# Patient Record
Sex: Female | Born: 1935 | Hispanic: No | Smoking: Former smoker
Health system: Southern US, Community
[De-identification: ages and names within clinical notes are randomized; demographics above are authoritative.]

## PROBLEM LIST (undated history)

## (undated) DIAGNOSIS — I739 Peripheral vascular disease, unspecified: Secondary | ICD-10-CM

## (undated) DIAGNOSIS — Z8781 Personal history of (healed) traumatic fracture: Secondary | ICD-10-CM

## (undated) DIAGNOSIS — I272 Pulmonary hypertension, unspecified: Secondary | ICD-10-CM

## (undated) DIAGNOSIS — N183 Chronic kidney disease, stage 3 unspecified: Secondary | ICD-10-CM

## (undated) DIAGNOSIS — I5032 Chronic diastolic (congestive) heart failure: Secondary | ICD-10-CM

## (undated) DIAGNOSIS — J449 Chronic obstructive pulmonary disease, unspecified: Secondary | ICD-10-CM

## (undated) DIAGNOSIS — E039 Hypothyroidism, unspecified: Secondary | ICD-10-CM

## (undated) DIAGNOSIS — M1990 Unspecified osteoarthritis, unspecified site: Secondary | ICD-10-CM

## (undated) DIAGNOSIS — I10 Essential (primary) hypertension: Secondary | ICD-10-CM

## (undated) DIAGNOSIS — I779 Disorder of arteries and arterioles, unspecified: Secondary | ICD-10-CM

## (undated) DIAGNOSIS — I4891 Unspecified atrial fibrillation: Secondary | ICD-10-CM

## (undated) DIAGNOSIS — R609 Edema, unspecified: Secondary | ICD-10-CM

## (undated) HISTORY — DX: Pulmonary hypertension, unspecified: I27.20

## (undated) HISTORY — PX: HEMORRHOID SURGERY: SHX153

## (undated) HISTORY — DX: Chronic kidney disease, stage 3 unspecified: N18.30

## (undated) HISTORY — DX: Chronic kidney disease, stage 3 (moderate): N18.3

## (undated) HISTORY — DX: Peripheral vascular disease, unspecified: I73.9

## (undated) HISTORY — DX: Personal history of (healed) traumatic fracture: Z87.81

## (undated) HISTORY — DX: Essential (primary) hypertension: I10

## (undated) HISTORY — DX: Edema, unspecified: R60.9

## (undated) HISTORY — DX: Chronic diastolic (congestive) heart failure: I50.32

## (undated) HISTORY — DX: Unspecified atrial fibrillation: I48.91

## (undated) HISTORY — DX: Disorder of arteries and arterioles, unspecified: I77.9

## (undated) HISTORY — DX: Hypothyroidism, unspecified: E03.9

---

## 2011-12-04 DIAGNOSIS — I5033 Acute on chronic diastolic (congestive) heart failure: Secondary | ICD-10-CM

## 2011-12-04 DIAGNOSIS — R0602 Shortness of breath: Secondary | ICD-10-CM

## 2011-12-17 ENCOUNTER — Inpatient Hospital Stay (HOSPITAL_COMMUNITY)
Admission: AD | Admit: 2011-12-17 | Discharge: 2011-12-22 | DRG: 208 | Disposition: A | Discharge status: Home / Self Care | Payer: Medicare Other | Source: Other Acute Inpatient Hospital | Attending: Pulmonary Disease | Admitting: Pulmonary Disease

## 2011-12-17 ENCOUNTER — Inpatient Hospital Stay (HOSPITAL_COMMUNITY): Payer: Medicare Other

## 2011-12-17 ENCOUNTER — Encounter (HOSPITAL_COMMUNITY): Payer: Self-pay | Admitting: Internal Medicine

## 2011-12-17 DIAGNOSIS — N189 Chronic kidney disease, unspecified: Secondary | ICD-10-CM | POA: Diagnosis present

## 2011-12-17 DIAGNOSIS — J449 Chronic obstructive pulmonary disease, unspecified: Secondary | ICD-10-CM

## 2011-12-17 DIAGNOSIS — R7309 Other abnormal glucose: Secondary | ICD-10-CM | POA: Diagnosis present

## 2011-12-17 DIAGNOSIS — N179 Acute kidney failure, unspecified: Secondary | ICD-10-CM | POA: Diagnosis present

## 2011-12-17 DIAGNOSIS — N289 Disorder of kidney and ureter, unspecified: Secondary | ICD-10-CM

## 2011-12-17 DIAGNOSIS — J9600 Acute respiratory failure, unspecified whether with hypoxia or hypercapnia: Principal | ICD-10-CM | POA: Diagnosis present

## 2011-12-17 DIAGNOSIS — I129 Hypertensive chronic kidney disease with stage 1 through stage 4 chronic kidney disease, or unspecified chronic kidney disease: Secondary | ICD-10-CM | POA: Diagnosis present

## 2011-12-17 DIAGNOSIS — I4891 Unspecified atrial fibrillation: Secondary | ICD-10-CM

## 2011-12-17 DIAGNOSIS — I5033 Acute on chronic diastolic (congestive) heart failure: Secondary | ICD-10-CM | POA: Diagnosis present

## 2011-12-17 DIAGNOSIS — J189 Pneumonia, unspecified organism: Secondary | ICD-10-CM | POA: Diagnosis present

## 2011-12-17 DIAGNOSIS — J9602 Acute respiratory failure with hypercapnia: Secondary | ICD-10-CM

## 2011-12-17 DIAGNOSIS — I2789 Other specified pulmonary heart diseases: Secondary | ICD-10-CM | POA: Diagnosis present

## 2011-12-17 DIAGNOSIS — I160 Hypertensive urgency: Secondary | ICD-10-CM

## 2011-12-17 DIAGNOSIS — I509 Heart failure, unspecified: Secondary | ICD-10-CM | POA: Diagnosis present

## 2011-12-17 DIAGNOSIS — J441 Chronic obstructive pulmonary disease with (acute) exacerbation: Secondary | ICD-10-CM | POA: Diagnosis present

## 2011-12-17 DIAGNOSIS — I10 Essential (primary) hypertension: Secondary | ICD-10-CM

## 2011-12-17 DIAGNOSIS — I272 Pulmonary hypertension, unspecified: Secondary | ICD-10-CM

## 2011-12-17 DIAGNOSIS — Z23 Encounter for immunization: Secondary | ICD-10-CM

## 2011-12-17 DIAGNOSIS — I5032 Chronic diastolic (congestive) heart failure: Secondary | ICD-10-CM

## 2011-12-17 HISTORY — DX: Chronic obstructive pulmonary disease, unspecified: J44.9

## 2011-12-17 LAB — COMPREHENSIVE METABOLIC PANEL
Albumin: 3.3 g/dL — ABNORMAL LOW (ref 3.5–5.2)
Alkaline Phosphatase: 63 U/L (ref 39–117)
BUN: 30 mg/dL — ABNORMAL HIGH (ref 6–23)
Calcium: 9.1 mg/dL (ref 8.4–10.5)
Creatinine, Ser: 1.34 mg/dL — ABNORMAL HIGH (ref 0.50–1.10)
GFR calc Af Amer: 44 mL/min — ABNORMAL LOW (ref >90)
Glucose, Bld: 149 mg/dL — ABNORMAL HIGH (ref 70–99)
Potassium: 4.3 mEq/L (ref 3.5–5.1)
Total Protein: 6.3 g/dL (ref 6.0–8.3)

## 2011-12-17 LAB — POCT I-STAT 3, ART BLOOD GAS (G3+)
Acid-Base Excess: 8 mmol/L — ABNORMAL HIGH (ref 0.0–2.0)
Bicarbonate: 34.8 meq/L — ABNORMAL HIGH (ref 20.0–24.0)
O2 Saturation: 100 %
Patient temperature: 98.6
TCO2: 37 mmol/L (ref 0–100)
pCO2 arterial: 56.1 mmHg — ABNORMAL HIGH (ref 35.0–45.0)
pH, Arterial: 7.401 — ABNORMAL HIGH (ref 7.350–7.400)
pO2, Arterial: 236 mmHg — ABNORMAL HIGH (ref 80.0–100.0)

## 2011-12-17 LAB — CBC
HCT: 36.3 % (ref 36.0–46.0)
Hemoglobin: 11.8 g/dL — ABNORMAL LOW (ref 12.0–15.0)
MCH: 29.2 pg (ref 26.0–34.0)
MCHC: 32.5 g/dL (ref 30.0–36.0)
MCV: 89.9 fL (ref 78.0–100.0)
Platelets: 218 K/uL (ref 150–400)
RBC: 4.04 MIL/uL (ref 3.87–5.11)
RDW: 15.5 % (ref 11.5–15.5)
WBC: 15.1 K/uL — ABNORMAL HIGH (ref 4.0–10.5)

## 2011-12-17 LAB — MAGNESIUM: Magnesium: 2.2 mg/dL (ref 1.5–2.5)

## 2011-12-17 LAB — MRSA PCR SCREENING: MRSA by PCR: NEGATIVE

## 2011-12-17 LAB — LIPASE, BLOOD: Lipase: 19 U/L (ref 11–59)

## 2011-12-17 LAB — HEMOGLOBIN A1C
Hgb A1c MFr Bld: 5.7 % — ABNORMAL HIGH (ref <5.7)
Mean Plasma Glucose: 117 mg/dL — ABNORMAL HIGH (ref <117)

## 2011-12-17 LAB — PROCALCITONIN: Procalcitonin: 4.49 ng/mL

## 2011-12-17 LAB — CORTISOL: Cortisol, Plasma: 35.6 ug/dL

## 2011-12-17 LAB — CARDIAC PANEL(CRET KIN+CKTOT+MB+TROPI)
CK, MB: 9.1 ng/mL (ref 0.3–4.0)
CK, MB: 4 ng/mL (ref 0.3–4.0)
Relative Index: 4.2 — ABNORMAL HIGH (ref 0.0–2.5)
Relative Index: 4.3 — ABNORMAL HIGH (ref 0.0–2.5)
Total CK: 218 U/L — ABNORMAL HIGH (ref 7–177)
Total CK: 157 U/L (ref 7–177)
Total CK: 109 U/L (ref 7–177)
Troponin I: 0.7 ng/mL

## 2011-12-17 LAB — GLUCOSE, CAPILLARY
Glucose-Capillary: 154 mg/dL — ABNORMAL HIGH (ref 70–99)
Glucose-Capillary: 119 mg/dL — ABNORMAL HIGH (ref 70–99)

## 2011-12-17 LAB — PRO B NATRIURETIC PEPTIDE: Pro B Natriuretic peptide (BNP): 29375 pg/mL — ABNORMAL HIGH (ref 0–450)

## 2011-12-17 LAB — PHOSPHORUS: Phosphorus: 4.9 mg/dL — ABNORMAL HIGH (ref 2.3–4.6)

## 2011-12-17 MED ORDER — PIPERACILLIN-TAZOBACTAM 3.375 G IVPB
3.3750 g | Freq: Three times a day (TID) | INTRAVENOUS | Status: DC
  Administered 2011-12-17 – 2011-12-20 (×9): 3.375 g via INTRAVENOUS
  Filled 2011-12-17 (×11): qty 50

## 2011-12-17 MED ORDER — VANCOMYCIN HCL 500 MG IV SOLR
500.0000 mg | INTRAVENOUS | Status: DC
  Administered 2011-12-18 – 2011-12-19 (×2): 500 mg via INTRAVENOUS
  Filled 2011-12-17 (×4): qty 500

## 2011-12-17 MED ORDER — FENTANYL CITRATE 0.05 MG/ML IJ SOLN: 25.0000 ug | INTRAMUSCULAR | Status: DC | PRN

## 2011-12-17 MED ORDER — HEPARIN SODIUM (PORCINE) 5000 UNIT/ML IJ SOLN
5000.0000 [IU] | Freq: Three times a day (TID) | INTRAMUSCULAR | Status: DC
  Administered 2011-12-17 – 2011-12-20 (×10): 5000 [IU] via SUBCUTANEOUS
  Filled 2011-12-17 (×18): qty 1

## 2011-12-17 MED ORDER — SODIUM CHLORIDE 0.9 % IV SOLN
250.0000 mL | INTRAVENOUS | Status: DC | PRN
  Administered 2011-12-17: 250 mL via INTRAVENOUS

## 2011-12-17 MED ORDER — SODIUM CHLORIDE 0.9 % IV SOLN: 250.0000 mL | INTRAVENOUS | Status: DC | PRN

## 2011-12-17 MED ORDER — ASPIRIN EC 81 MG PO TBEC
81.0000 mg | DELAYED_RELEASE_TABLET | Freq: Every day | ORAL | Status: DC
  Administered 2011-12-18 – 2011-12-22 (×5): 81 mg via ORAL
  Filled 2011-12-17 (×6): qty 1

## 2011-12-17 MED ORDER — ASPIRIN 81 MG PO CHEW
CHEWABLE_TABLET | ORAL | Status: AC
  Administered 2011-12-17: 81 mg
  Filled 2011-12-17: qty 1

## 2011-12-17 MED ORDER — SODIUM CHLORIDE 0.9 % IJ SOLN: 3.0000 mL | INTRAMUSCULAR | Status: DC | PRN

## 2011-12-17 MED ORDER — PNEUMOCOCCAL VAC POLYVALENT 25 MCG/0.5ML IJ INJ
0.5000 mL | INJECTION | INTRAMUSCULAR | Status: AC
  Administered 2011-12-18: 0.5 mL via INTRAMUSCULAR
  Filled 2011-12-17: qty 0.5

## 2011-12-17 MED ORDER — AMIODARONE HCL IN DEXTROSE 360-4.14 MG/200ML-% IV SOLN
30.0000 mg/h | INTRAVENOUS | Status: DC
  Administered 2011-12-18 – 2011-12-19 (×4): 30 mg/h via INTRAVENOUS
  Filled 2011-12-17 (×7): qty 200

## 2011-12-17 MED ORDER — SODIUM CHLORIDE 0.9 % IV SOLN
250.0000 mL | INTRAVENOUS | Status: DC | PRN
  Administered 2011-12-17: 10 mL/h via INTRAVENOUS

## 2011-12-17 MED ORDER — PANTOPRAZOLE SODIUM 40 MG PO PACK
40.0000 mg | PACK | Freq: Every day | ORAL | Status: DC
  Administered 2011-12-17 – 2011-12-19 (×3): 40 mg
  Filled 2011-12-17 (×6): qty 20

## 2011-12-17 MED ORDER — ALBUTEROL SULFATE (5 MG/ML) 0.5% IN NEBU: 2.5000 mg | INHALATION_SOLUTION | RESPIRATORY_TRACT | Status: DC | PRN

## 2011-12-17 MED ORDER — IPRATROPIUM-ALBUTEROL 18-103 MCG/ACT IN AERO
6.0000 | INHALATION_SPRAY | RESPIRATORY_TRACT | Status: DC
  Administered 2011-12-17 – 2011-12-18 (×7): 6 via RESPIRATORY_TRACT
  Filled 2011-12-17: qty 14.7

## 2011-12-17 MED ORDER — FUROSEMIDE 10 MG/ML IJ SOLN
40.0000 mg | Freq: Once | INTRAMUSCULAR | Status: DC
  Filled 2011-12-17: qty 4

## 2011-12-17 MED ORDER — BIOTENE DRY MOUTH MT LIQD
15.0000 mL | Freq: Four times a day (QID) | OROMUCOSAL | Status: DC
  Administered 2011-12-17 – 2011-12-22 (×18): 15 mL via OROMUCOSAL

## 2011-12-17 MED ORDER — OSMOLITE 1.5 CAL PO LIQD
1000.0000 mL | ORAL | Status: DC
  Administered 2011-12-17: 1000 mL
  Filled 2011-12-17 (×3): qty 1000

## 2011-12-17 MED ORDER — CHLORHEXIDINE GLUCONATE 0.12 % MT SOLN
15.0000 mL | Freq: Two times a day (BID) | OROMUCOSAL | Status: DC
  Administered 2011-12-17 – 2011-12-18 (×3): 15 mL via OROMUCOSAL
  Filled 2011-12-17 (×3): qty 15

## 2011-12-17 MED ORDER — MIDAZOLAM HCL 2 MG/2ML IJ SOLN: 1.0000 mg | INTRAMUSCULAR | Status: DC | PRN

## 2011-12-17 MED ORDER — INSULIN ASPART 100 UNIT/ML ~~LOC~~ SOLN
0.0000 [IU] | SUBCUTANEOUS | Status: DC
  Administered 2011-12-17 – 2011-12-18 (×4): 1 [IU] via SUBCUTANEOUS
  Administered 2011-12-18 (×3): 3 [IU] via SUBCUTANEOUS
  Administered 2011-12-18: 1 [IU] via SUBCUTANEOUS
  Administered 2011-12-18: 3 [IU] via SUBCUTANEOUS
  Administered 2011-12-19: 4 [IU] via SUBCUTANEOUS
  Administered 2011-12-19 (×2): 1 [IU] via SUBCUTANEOUS
  Administered 2011-12-19 (×2): 3 [IU] via SUBCUTANEOUS
  Administered 2011-12-20 (×3): 1 [IU] via SUBCUTANEOUS

## 2011-12-17 MED ORDER — DEXTROSE 10 % IV SOLN: INTRAVENOUS | Status: DC

## 2011-12-17 MED ORDER — FUROSEMIDE 10 MG/ML IJ SOLN
40.0000 mg | Freq: Three times a day (TID) | INTRAMUSCULAR | Status: AC
  Administered 2011-12-17 (×2): 40 mg via INTRAVENOUS
  Filled 2011-12-17 (×2): qty 4

## 2011-12-17 MED ORDER — FUROSEMIDE 10 MG/ML IJ SOLN
40.0000 mg | Freq: Two times a day (BID) | INTRAMUSCULAR | Status: DC
  Administered 2011-12-17: 40 mg via INTRAVENOUS
  Filled 2011-12-17 (×3): qty 4

## 2011-12-17 MED ORDER — DILTIAZEM HCL 100 MG IV SOLR
5.0000 mg/h | INTRAVENOUS | Status: DC
  Administered 2011-12-17: 5 mg/h via INTRAVENOUS
  Filled 2011-12-17: qty 100

## 2011-12-17 MED ORDER — PRO-STAT SUGAR FREE PO LIQD
30.0000 mL | Freq: Every day | ORAL | Status: DC
  Administered 2011-12-17: 30 mL
  Filled 2011-12-17 (×2): qty 30

## 2011-12-17 MED ORDER — SODIUM CHLORIDE 0.9 % IJ SOLN
3.0000 mL | Freq: Two times a day (BID) | INTRAMUSCULAR | Status: DC
  Administered 2011-12-17 – 2011-12-20 (×4): 3 mL via INTRAVENOUS

## 2011-12-17 MED ORDER — PROPOFOL 10 MG/ML IV EMUL
5.0000 ug/kg/min | INTRAVENOUS | Status: DC
  Administered 2011-12-17 (×3): 20 ug/kg/min via INTRAVENOUS
  Administered 2011-12-18: 30 ug/kg/min via INTRAVENOUS
  Filled 2011-12-17 (×3): qty 100

## 2011-12-17 MED ORDER — FENTANYL CITRATE 0.05 MG/ML IJ SOLN
25.0000 ug | INTRAMUSCULAR | Status: DC | PRN
  Administered 2011-12-17 – 2011-12-18 (×2): 50 ug via INTRAVENOUS
  Filled 2011-12-17 (×4): qty 2

## 2011-12-17 MED ORDER — ADULT MULTIVITAMIN LIQUID CH
5.0000 mL | Freq: Every day | ORAL | Status: DC
  Administered 2011-12-17 – 2011-12-21 (×5): 5 mL
  Filled 2011-12-17 (×5): qty 5

## 2011-12-17 MED ORDER — AMIODARONE LOAD VIA INFUSION
150.0000 mg | Freq: Once | INTRAVENOUS | Status: AC
  Administered 2011-12-17: 150 mg via INTRAVENOUS
  Filled 2011-12-17: qty 83.34

## 2011-12-17 MED ORDER — METHYLPREDNISOLONE SODIUM SUCC 40 MG IJ SOLR
40.0000 mg | Freq: Four times a day (QID) | INTRAMUSCULAR | Status: DC
  Administered 2011-12-17 – 2011-12-20 (×13): 40 mg via INTRAVENOUS
  Filled 2011-12-17 (×16): qty 1

## 2011-12-17 MED ORDER — IPRATROPIUM BROMIDE HFA 17 MCG/ACT IN AERS
2.0000 | INHALATION_SPRAY | Freq: Four times a day (QID) | RESPIRATORY_TRACT | Status: DC
  Administered 2011-12-17: 2 via RESPIRATORY_TRACT
  Filled 2011-12-17: qty 12.9

## 2011-12-17 MED ORDER — SODIUM CHLORIDE 0.9 % IV SOLN
INTRAVENOUS | Status: DC | PRN
  Administered 2011-12-17: 5 mL/h via INTRAVENOUS

## 2011-12-17 MED ORDER — AMIODARONE HCL IN DEXTROSE 360-4.14 MG/200ML-% IV SOLN
60.0000 mg/h | INTRAVENOUS | Status: AC
  Administered 2011-12-17 – 2011-12-18 (×2): 60 mg/h via INTRAVENOUS
  Filled 2011-12-17 (×2): qty 200

## 2011-12-17 MED ORDER — DEXTROSE 10 % IV SOLN: INTRAVENOUS | Status: DC | PRN

## 2011-12-17 MED ORDER — DILTIAZEM LOAD VIA INFUSION
10.0000 mg | Freq: Once | INTRAVENOUS | Status: AC
  Administered 2011-12-17: 10 mg via INTRAVENOUS
  Filled 2011-12-17: qty 10

## 2011-12-17 MED ORDER — VANCOMYCIN HCL 1000 MG IV SOLR
750.0000 mg | Freq: Once | INTRAVENOUS | Status: AC
  Administered 2011-12-17: 750 mg via INTRAVENOUS
  Filled 2011-12-17: qty 750

## 2011-12-17 MED FILL — Nitroglycerin IV Soln 200 MCG/ML in D5W: INTRAVENOUS | Qty: 250 | Status: AC

## 2011-12-17 NOTE — Progress Notes (Signed)
INITIAL ADULT NUTRITION ASSESSMENT Date: 12/17/2011   Time: 10:50 AM Reason for Assessment: Consult, TF initiation and management   ASSESSMENT: Female 78 y.o.  Dx: Acute respiratory failure with hypercapnia  Hx:  Past Medical History  Diagnosis Date  . CHF (congestive heart failure)   . COPD (chronic obstructive pulmonary disease)     Related Meds:     . albuterol-ipratropium  6 puff Inhalation Q4H  . antiseptic oral rinse  15 mL Mouth Rinse QID  . aspirin EC  81 mg Oral Daily  . chlorhexidine  15 mL Mouth Rinse BID  . furosemide  40 mg Intravenous Q8H  . heparin  5,000 Units Subcutaneous Q8H  . insulin aspart  0-4 Units Subcutaneous Q4H  . methylPREDNISolone (SOLU-MEDROL) injection  40 mg Intravenous Q6H  . pantoprazole sodium  40 mg Per Tube Q1200  . pneumococcal 23 valent vaccine  0.5 mL Intramuscular Tomorrow-1000  . sodium chloride  3 mL Intravenous Q12H  . DISCONTD: furosemide  40 mg Intravenous Once  . DISCONTD: furosemide  40 mg Intravenous BID  . DISCONTD: ipratropium  2 puff Inhalation Q6H     Ht: 5\' 4"  (162.6 cm)  Wt: 103 lb 9.9 oz (47 kg)  Ideal Wt: 54.5 kg % Ideal Wt: 86%  Usual Wt: unable to obtain % Usual Wt: --  Body mass index is 17.79 kg/(m^2). Underweight  Food/Nutrition Related Hx: No information provided in nutrition risk assessment. No family available to speak with at this time.   Labs:  CMP     Component Value Date/Time   NA 139 12/17/2011 0650   K 4.3 12/17/2011 0650   CL 96 12/17/2011 0650   CO2 34* 12/17/2011 0650   GLUCOSE 149* 12/17/2011 0650   BUN 30* 12/17/2011 0650   CREATININE 1.34* 12/17/2011 0650   CALCIUM 9.1 12/17/2011 0650   PROT 6.3 12/17/2011 0650   ALBUMIN 3.3* 12/17/2011 0650   AST 41* 12/17/2011 0650   ALT 30 12/17/2011 0650   ALKPHOS 63 12/17/2011 0650   BILITOT 0.4 12/17/2011 0650   GFRNONAA 38* 12/17/2011 0650   GFRAA 44* 12/17/2011 0650  Phos  4.9 (H)  Mag   2.2 WNL   Intake/Output Summary (Last 24 hours) at  12/17/11 1156 Last data filed at 12/17/11 1113  Gross per 24 hour  Intake  68.41 ml  Output   1505 ml  Net -1436.59 ml     Diet Order: NPO  Supplements/Tube Feeding: none  IVF:    dextrose   propofol Last Rate: 20 mcg/kg/min (12/17/11 0951)    Estimated Nutritional Needs:   Kcal: 1350 Protein: 67-90 gm Fluid:   1.2 L  Pt with recent dx of COPD and CHF, per reports not taking meds PTA. Deep pitting edema noted per flow sheet. Pt is diuresing. CBG's have been high, A1c to be checked, no results for this yet.  Pt receiving propofol 5.6 ml/hr providing 147 kcal daily.  RD consulted to initiate and manage TF.   NUTRITION DIAGNOSIS: -Inadequate oral intake (NI-2.1).  Status: Ongoing  RELATED TO: inability to eat  AS EVIDENCE BY: NPO status, vent dependant  MONITORING/EVALUATION(Goals): Goal: TF to provide >90% of estimated nutrition needs Monitor: TF initiation, vent, weight, labs, I/O's  EDUCATION NEEDS: -No education needs identified at this time  INTERVENTION: 1. Initiate TF via OG tube using Osmolite 1.5 starting at 20 ml/hr and advancing by 10 ml q 8 hours as tolerated to a goal rate of 30 ml/hr 2. Provide  30 ml Pro-stat once daily via OG tube.   TF regimen and propofol will provide 1327 kcal, 60 gm protein and 549 ml free water.  3. Additional liquid multivitamin via OG tube daily as TF does not meet 100% of vitamin/mineral needs at this rate. 4. RD will continue to follow  Dietitian [phone]  DOCUMENTATION CODES Per approved criteria  -Underweight    KOWALSKI, April Colter MARIE 12/17/2011, 10:50 AM

## 2011-12-17 NOTE — Consult Note (Signed)
CARDIOLOGY CONSULT NOTE  Patient ID: *PLEASE NOTE - patient's name was initially wrong in our EMR. Clarified with family - it is Lynn Evans, Lynn Evans. Nursing in progress of helping to change. MRN: [id number], DOB/AGE: December 22, 1933 78 y.o. Admit date: 12/17/2011 Date of Consult: 12/17/2011  Primary Cardiologist: Dr. Diona Evans recently saw her at [facility] 12/04/11 in consult Chief Complaint: SOB Reason for Consult: CHF  HPI: 78 y/o F with reported hx of recently diagnosed diastolic CHF, COPD presented initially to [facility] yesterday after her home health nurse noted increased SOB/BP. Vitals per ER note were P127, BP 236/123. The ER report states that family reported she had recently been taken off of her CHF medications and had increased swelling the last few days (current med list per pharmacy only lists COPD medications). Hospitalization notes from 4/11-4/14 also indicate she expressed an aversion to taking too many medications at home - she is currently intubated so unsure if compliance was an issue. She was placed on bipap while ABG was obtained. The patient was acidotic with pH 7.04, pCO2 117, and PO2 176 on ABG available from yesterday. She was subequently intubated. She was given 80mg  IV Lasix and also started on NTG gtt for HTN which resulted in slight hypotension, which she is now off of. At [facility], WBC was 20k with increased neurophils, POC troponin 0.17. Here at [facility], proBNP is [phone], procalcitonin elevated at 4.49, lactic acid 3.4, CK 218/MB 9.1/troponin 0.70, WBC 15.1, BUN/Cr 30/1.34. CXR suggests pulmonary edema with bilateral pleural effusions most consistent with CHF, versus aspiration giving similar appearance. She remains intubated and sedated, and other meds include aspirin 81mg , lasix 40mg  IV q8rs, solu-medtrol 40mg  IV q6hrs, protonix, and albuterol. Pan culture is in progress. On tele thus far she is maintaining NSR in the 90s with occasional PACs, also has had approximately 3  brief episodes of an atrial tach. She has had brisk diuresis thus far per nursing.  The patient was recently admitted 4/11-4/14 with hypoxia & new diagnoses of COPD exacerbation, severe pHTN, right-sided CHF and cor pulmonale and acute diastolic CHF. CT angio was negative for PE per the discharge summary, and Dr. Diona Evans notes that it also showed bilateral pleural effusions, diffuse bilateral emphysematous changes, apparent scarring RML denser in R apex, and 1.2cm precarinal lymph node of uncertain significance. Per a followup note 4/14 from Dr. Leeann Evans (cardiology fellow), [name]'s 2D echo showed EF 60-65%, evidence of impaired relaxation and diastolic dysfunction, and RVSP of 63.   Past Medical History  Diagnosis Date  . CHF (congestive heart failure)   . COPD (chronic obstructive pulmonary disease)       Most Recent Cardiac Studies: Per Consultation Note from 12/06/11: 2D echo showed EF 60-65%, evidence of impaired relaxation and diastolic dysfunction, and RVSP of 63.   Home Meds: Prior to Admission medications   Medication Sig Start Date End Date Taking? Authorizing Provider  albuterol (PROVENTIL HFA;VENTOLIN HFA) 108 (90 BASE) MCG/ACT inhaler Inhale 2 puffs into the lungs every 8 (eight) hours as needed. Shortness of breath   Yes Historical Provider, MD  budesonide-formoterol (SYMBICORT) 160-4.5 MCG/ACT inhaler Inhale 2 puffs into the lungs 2 (two) times daily.   Yes Historical Provider, MD  Calcium Carbonate Antacid (TUMS PO) Take 1 tablet by mouth as needed. For heartburn/upset stomach   Yes Historical Provider, MD  tiotropium (SPIRIVA) 18 MCG inhalation capsule Place 18 mcg into inhaler and inhale daily.   Yes Historical Provider, MD    Inpatient Medications:     .  albuterol-ipratropium  6 puff Inhalation Q4H  . antiseptic oral rinse  15 mL Mouth Rinse QID  . aspirin      . aspirin EC  81 mg Oral Daily  . chlorhexidine  15 mL Mouth Rinse BID  . feeding supplement  (OSMOLITE 1.5 CAL)  1,000 mL Per Tube Q24H  . feeding supplement  30 mL Per Tube Q1200  . furosemide  40 mg Intravenous Q8H  . heparin  5,000 Units Subcutaneous Q8H  . insulin aspart  0-4 Units Subcutaneous Q4H  . methylPREDNISolone (SOLU-MEDROL) injection  40 mg Intravenous Q6H  . mulitivitamin  5 mL Per Tube Daily  . pantoprazole sodium  40 mg Per Tube Q1200  . pneumococcal 23 valent vaccine  0.5 mL Intramuscular Tomorrow-1000  . sodium chloride  3 mL Intravenous Q12H  . DISCONTD: furosemide  40 mg Intravenous Once  . DISCONTD: furosemide  40 mg Intravenous BID  . DISCONTD: ipratropium  2 puff Inhalation Q6H    Allergies: No Known Allergies  History   Social History  . Marital Status: N/A    Spouse Name: N/A    Number of Children: N/A  . Years of Education: N/A   Occupational History  . Not on file.   Social History Main Topics  . Smoking status: Former Smoker -- 1.0 packs/day for 57 years    Types: Cigarettes    Quit date: 11/16/2011  . Smokeless tobacco: Former Neurosurgeon  . Alcohol Use: No  . Drug Use: No  . Sexually Active: Not on file   Other Topics Concern  . Not on file   Social History Narrative  . No narrative on file     Family History  Problem Relation Age of Onset  . Colon cancer Mother   . Cirrhosis Father      Review of Systems: unable to obtain as patient is sedated and intubated  Labs:  [facility] 12/17/11 0650  CKTOTAL 218*  CKMB 9.1*  TROPONINI 0.70*   Lab Results  Component Value Date   WBC 15.1* 12/17/2011   HGB 11.8* 12/17/2011   HCT 36.3 12/17/2011   MCV 89.9 12/17/2011   PLT 218 12/17/2011     Lab 12/17/11 0650  NA 139  K 4.3  CL 96  CO2 34*  BUN 30*  CREATININE 1.34*  CALCIUM 9.1  PROT 6.3  BILITOT 0.4  ALKPHOS 63  ALT 30  AST 41*  GLUCOSE 149*   Radiology/Studies:  1.  Chest Port 1 View 12/17/2011  *RADIOLOGY REPORT*  Clinical Data: Intubation.  PORTABLE CHEST - 1 VIEW  Comparison: None.  Findings: 0621 hours.   Endotracheal tube is in the mid trachea, 3.1 cm above the carina.  A nasogastric tube projects below the diaphragm.  There are bilateral perihilar airspace opacities associated with poor definition of the pulmonary vasculature and bilateral pleural effusions.  No pneumothorax is identified.  The heart size is normal.  There is aortic atherosclerosis. Degenerative changes are seen within the lower thoracic spine.  IMPRESSION:  1.  Endotracheal tube and nasogastric tube appear well positioned. 2.  Pulmonary edema with bilateral pleural effusions most consistent with congestive heart failure.  Aspiration could have a similar appearance.  Original Report Authenticated By: Gerrianne Scale, M.D.   2. CT [facility] at [facility] 12/17/11- no acute intracranial abnormalities  EKG: EKG 12/17/11 at 10:35am - NSR with PAC noted, no acute changes. Initial EKG appears sinus tach with nonspeciifc ST changes, old anterior infarct, wandering baseline.  Borderline short PR intervals on prior EKG without evidence for delta wave  Physical Exam: Blood pressure 156/72, pulse 99, temperature 98.3 F (36.8 C), temperature source Axillary, resp. rate 20, height 5\' 4"  (1.626 m), weight 103 lb 9.9 oz (47 kg), SpO2 100.00%. General: Well developed, well nourished, in no acute distress. Head: Normocephalic, atraumatic, sclera non-icteric, no xanthomas, nares are without discharge.  Neck: JVD is mildly elevated. Lungs: Rhonchorous throughout, on vent with breathing unlabored. Heart: RRR (borderline tachy) with S1 S2. No murmurs, rubs, or gallops appreciated. Abdomen: Soft, non-tender, non-distended with normoactive bowel sounds. No hepatomegaly. No rebound/guarding. No obvious abdominal masses. Msk:  Strength and tone appear normal for age. Extremities: No clubbing or cyanosis. Tr edema of feet.  Distal pedal pulses are 2+ and equal bilaterally. Neuro: Sedated on vent. Psych:  Nonverbal.   Assessment and Plan:   1. Acute  respiratory failure, hypercarbic with recent dx of COPD, question of infection with elevated procalcitonin - PCCM managing. Clarified with Dr. Molli Knock that patient will be started on vanc/zosyn per pharmacy (nurse made aware that verbal order was requested). 2. Pulmonary edema likely secondary to acute on chronic (recently diagnosed) diastolic CHF - this is likely secondary to a hypertensive response in the setting of acute hypercarbic respiratory failure. Family mentioned to nursing that the patient went home on O2 at 4/14 discharge - ?if propensity to retain CO2 was an issue here. There is also question of being taken off CHF meds by MD vs patient compliance issues. She has had excellent response to diuresis thus far - would continue present Lasix dosing and follow Cr, blood pressure, rhythm, I&s, weights. She had recent echo so would not repeat another for now. 3. Hypertensive urgency on presentation, improved. 4. Mildly elevated cardiac enzymes - likely secondary to #1-3. No ischemic changes on EKG. Follow. May need ischemic rule out once she recovers from acute illness. Continue ASA. No BB due to COPD. Check lipid panel in AM. 5. Acute renal insufficiency - follow closely with diuresis. 6. Hyperglycemia - per primary team. A1C in progress.  Signed, Ronie Spies PA-C 12/17/2011, 12:27 PM   Patient seen and examined. I agree with the assessment and plan as detailed above. See also my additional thoughts below.   We know that the patient has severe pulmonary hypertension. It is also known that there is good systolic left ventricular function. The patient has presented with pulmonary edema. It seems somewhat unusual that she has hypercarbic respiratory failure at presentation. However her chest x-ray is wet. Some of the issues may be related to compliance with the patient not taking meds properly at home. The plan for now will to proceed with diuresis and with pulmonary support including ventilator and  treatment of possible pneumonia or aspiration. As the patient stabilizes we'll make further plans about the exact regimen for her at home.  Willa Rough, MD, [facility] 12/17/2011 3:09 PM

## 2011-12-17 NOTE — H&P (Addendum)
Name: Lynn Evans MRN: [id number] DOB: Apr 30, 1934    LOS: 0  PCCM ADMISSION NOTE  History of Present Illness:  The patient is a 78 YO woman who comes as a transfer from [facility] hospital. She has recent diagnosis of COPD and CHF (dialstolic, EF 60-65%), who was seen by home health nurse and noted to have increased SOB and increased blood pressure. She was moved to the ED at [facility] and in respiratory distress and put on BIPAP while ABG was obtained. Pt was acidotic and was intubated. She was started on nitro drip for her hypertensive which caused her to become slightly hypotensive. She was maintained on the nitro drip throughout transfer to [facility] and her BP did come up slightly. She had not been taking her HF meds at home the past several days.  She had recently been discharged from [facility] after a new diagnosis of COPD, pulmonary hypertension and a new diagnosis of CHF.   Lines / Drains: 4/25 ETT >>  Cultures: Blood 4/25 >> Urine 4/25 >> Sputum 4/25 >>  Antibiotics: None  Tests / Events: 4/24 CXR [facility] edema 4/25 Tx to [facility] from [facility] for intubation and hypotension.  The patient is sedated, intubated and unable to provide history, which was obtained for available medical records.    Past Medical History  Diagnosis Date  . CHF (congestive heart failure)   . COPD (chronic obstructive pulmonary disease)    No past surgical history on file. Prior to Admission medications   Not on File   Allergies No Known Allergies, NKDA per [facility] however no one available to ask currently.   Family History Family History  Problem Relation Age of Onset  . Colon cancer Mother   . Cirrhosis Father     Social History  reports that she has been smoking.  She does not have any smokeless tobacco history on file. Her alcohol and drug histories not on file.  Review Of Systems  Patient unable to provide  Vital Signs: Pulse Rate:  [93] 93  (04/25 0545) Resp:  [17]  17  (04/25 0545) BP: (123)/(59) 123/59 mmHg (04/25 0545) FiO2 (%):  [70 %] 70 % (04/25 0500) Weight:  [103 lb 9.9 oz (47 kg)] 103 lb 9.9 oz (47 kg) (04/25 0545)    Physical Examination: General:  Intubated, mechanically ventilated, no acute distress Neuro:  Sedated, synchronous, nonfocal HEENT:  PERRL, pink conjunctivae, moist membranes Neck:  Supple, JVD to level of jaw Cardiovascular:  RRR, S3 present Lungs:  Bilateral diminished air entry, insp crackles in bases Abdomen:  Soft, nontender, nondistended, bowel sounds present, L renal bruit present Musculoskeletal:  Moves all extremities at baseline currently sedated and now WUA yet, pedal edema Skin:  No rash  Ventilator settings: Vent Mode:  [-] PRVC FiO2 (%):  [70 %] 70 % Set Rate:  [14 bmp] 14 bmp Vt Set:  [400 mL] 400 mL PEEP:  [5 cmH20] 5 cmH20 Plateau Pressure:  [17 cmH20] 17 cmH20  Labs and Imaging:  Reviewed.  Please refer to the Assessment and Plan section for relevant results.  CT head at [facility] - read as no active findings  CXR from [facility] - read as pulmonary edema  ASSESSMENT AND PLAN  This a 78 y/o female with newly diagnosed CHF and COPD who presented to [facility] hospital on 4/24 with acute onset hypoxemic respiratory failure.  She is volume overloaded on exam and there is no clear evidence of pneumonia.  Suspect that this is an  acute heart failure exacerbation.  NEUROLOGIC A:  CT head normal at [facility] -  No signs of bleed or stoke, mental status normal at baseline. Will monitor for signs of delirium. No signs of delirium currently.  P: -->  Propofol gtt to goal RASS 0 to -1. -->  Daily WUA  PULMONARY No results found for this basename: PHART:5,PCO2:5,PCO2ART:5,PO2ART:5,HCO3:5,O2SAT:5 in the last 168 hours A:  Acute Respiratory Failure - Hypoxemic and hypercarbic likely due to an acute CHF exacerbation.  No evidence of acute bronchoconscriction on physical exam.  COPD - Recent diagnosis, former  smoker, unclear if current. Bronchodilators and on symbicort at home.  P: -->  Full mechanical support -->  Goal pH>7.30, goal SpO2>92 -->  Follow up ABG -->  Follow up CXR -->  Bronchodilators -->  Daily SBT -->  Stop nitro drip  CARDIOVASCULAR No results found for this basename: TROPONINI:5,LATICACIDVEN:5, O2SATVEN:5,PROBNP:5 in the last 168 hours A:  Hypertension - Started on nitro drip at [facility] (236/123 on admission). Developed hypotension but blood pressure has come up since then.  Elevated troponins - Trop 0.17 at [facility], will cycle ezs. Unclear if she was having chest pain or not.  Acute on Chronic Diastolic HF - Per [facility] reports pt not taking her meds for several days and pulmonary edema on CXR. Given lasix at [facility], will follow urine output and use re-dosing as needed.  P: -->  Saline lock IV -->  Lactic acid, procalcitonin, cortisol level now. -->  Cycle CE --> Lasix now for edema. Follow urine output. Goal net negative 2-3 Liters in next 24 hours --> Still on nitro drip currently, will titrate to off and use prn meds for blood pressure as needed.   RENAL No results found for this basename: NA:5,K:2,CL:5,CO2:5,BUN:5,CREATININE:5,CALCIUM:5,MG:5,PHOS:5 in the last 168 hours A:  No active issues - BMP from [facility] shows normal K, Na, Cr 1.2. Baseline unclear P: -->  BMP. -->  Continue to monitor closely urine output with lasix.   GASTROINTESTINAL No results found for this basename: AST:5,ALT:5,ALKPHOS:5,BILITOT:5,PROT:5,ALBUMIN:5 in the last 168 hours A:  No active issues P: -->  Protonix for SUP  HEMATOLOGIC No results found for this basename: HGB:5,HCT:5,PLT:5,INR:5,APTT:5 in the last 168 hours A:  PT/INR normal, Hg stable - Hg 10 at [facility] P: -->  CBC  -->  Monitor closely.  INFECTIOUS No results found for this basename: WBC:5,PROCALCITON:5 in the last 168 hours A:  Leukocytosis - Possible demargination versus pneumonia. Will recheck CXR  later today after some diuresis. CXR from [facility] showed edema. P:  -->  Repeat CXR later today, urine culture, sputum culture, blood cultures.  --> Follow, no signs of active infection at this time. --> No antibiotics at this time, will start if febrile or family reports cough  ENDOCRINE No results found for this basename: GLUCAP:5 in the last 168 hours A:  Hyperglycemia - Glucose 300 on BMP from [facility], unclear if anything was done about this. Will start checking CBGs and using SSI. Check HgA1C P: -->  CBG checks / SSI per (ICU hyperglycemia protocol) -->  HgA1C  BEST PRACTICE / DISPOSITION -->  ICU status under PCCM -->  Full code -->  NPO -->  Heparin [place] for DVT Px -->  Protonix IV for GI Px -->  Ventilator bundle   KOLLAR, ELIZABETH 12/17/2011, 6:11 AM  Attending:  I have seen and examined the patient with Dr. Dorise Hiss and agree with and have modified her note above.  This lady has had a new  diagnosis of COPD, pulmonary hypertension and CHF in the last month and appears to have a CHF exacerbation right now.  She should probably have a left and right heart catheterization during this admission to assess for ischaemia and characterize her pulmonary hypertension.  We will diurese her aggressively today and hopefully extubate in her in the next 24 hours.  Total CC time this morning for me 45 minutes.  Yolonda Kida PCCM Pager: [phone] Cell: [phone] If no response, call [phone]

## 2011-12-17 NOTE — Progress Notes (Signed)
Name: Lynn Evans MRN: [id number] DOB: 1934-08-16  ELECTRONIC ICU PHYSICIAN NOTE  Problem:  Rapid afib  Intervention:  Start cardizem  Sandrea Hughs 12/17/2011, 8:36 PM

## 2011-12-17 NOTE — Progress Notes (Signed)
ANTIBIOTIC CONSULT NOTE - INITIAL  Pharmacy Consult for vancomycin/zosyn Indication: Pneumonia(RLL infiltrate)  No Known Allergies  Patient Measurements: Height: 5\' 4"  (162.6 cm) Weight: 103 lb 9.9 oz (47 kg) IBW/kg (Calculated) : 54.7   Vital Signs: Temp: 98.3 F (36.8 C) (04/25 1155) Temp src: Axillary (04/25 1155) BP: 120/60 mmHg (04/25 1400) Pulse Rate: 99  (04/25 1400) Intake/Output from previous day: 04/24 0701 - 04/25 0700 In: -  Out: 220 [Urine:220] Intake/Output from this shift: Total I/O In: 127.2 [I.V.:123.2; IV Piggyback:4] Out: 1870 [Urine:1870]  Labs:  [facility] 12/17/11 0650  WBC 15.1*  HGB 11.8*  PLT 218  LABCREA --  CREATININE 1.34*   Estimated Creatinine Clearance: 26.9 ml/min (by C-G formula based on Cr of 1.34). No results found for this basename: VANCOTROUGH:2,VANCOPEAK:2,VANCORANDOM:2,GENTTROUGH:2,GENTPEAK:2,GENTRANDOM:2,TOBRATROUGH:2,TOBRAPEAK:2,TOBRARND:2,AMIKACINPEAK:2,AMIKACINTROU:2,AMIKACIN:2, in the last 72 hours   Microbiology: Recent Results (from the past 720 hour(s))  MRSA PCR SCREENING     Status: Normal   Collection Time   12/17/11  6:02 AM      Component Value Range Status Comment   MRSA by PCR NEGATIVE  NEGATIVE  Final     Medical History: Past Medical History  Diagnosis Date  . CHF (congestive heart failure)   . COPD (chronic obstructive pulmonary disease)     Medications:  Prescriptions prior to admission  Medication Sig Dispense Refill  . albuterol (PROVENTIL HFA;VENTOLIN HFA) 108 (90 BASE) MCG/ACT inhaler Inhale 2 puffs into the lungs every 8 (eight) hours as needed. Shortness of breath      . budesonide-formoterol (SYMBICORT) 160-4.5 MCG/ACT inhaler Inhale 2 puffs into the lungs 2 (two) times daily.      . Calcium Carbonate Antacid (TUMS PO) Take 1 tablet by mouth as needed. For heartburn/upset stomach      . tiotropium (SPIRIVA) 18 MCG inhalation capsule Place 18 mcg into inhaler and inhale daily.        Assessment: 78 year old female transferred from [facility] after having respiratory failure requiring intubation. Patient's wbc is elevated at 15, no fevers noted, RLL infiltrate on xray will start empiric vancomycin and zosyn. Will dose adjust abx given decreased renal function.  Goal of Therapy:  Vancomycin trough level 15-20 mcg/ml  Plan:  Vancomycin 750mg  IV now then 500 q24 hours Zosyn 3.375g q8 hours - may need dose reduction if renal function worsens. Follow culture data and progress.  Lynn Evans 12/17/2011,2:31 PM

## 2011-12-17 NOTE — Progress Notes (Signed)
CRITICAL VALUE ALERT  Critical value received: CK/MB 9.1  Trop 0.70  Date of notification: 12/17/11  Time of notification: 0900  Critical value read back:yes  Nurse who received alert: P.Mikle Bosworth RN  MD notified (1st page): Dr.Yacoub  Time of first page:  0930  MD notified (2nd page):  Time of second page:  Responding MD:  Dr.Yacoub  Time MD responded: 0930

## 2011-12-17 NOTE — Progress Notes (Signed)
HPI:  The patient is a 78 YO woman who comes as a transfer from [facility] hospital. She has recent diagnosis of COPD and CHF (dialstolic, EF 60-65%), who was seen by home health nurse and noted to have increased SOB and increased blood pressure. She was moved to the ED at [facility] and in respiratory distress and put on BIPAP while ABG was obtained. Pt was acidotic and was intubated. She was started on nitro drip for her hypertensive which caused her to become slightly hypotensive. She was maintained on the nitro drip throughout transfer to [facility] and her BP did come up slightly. She had not been taking her HF meds at home the past several days.  She had recently been discharged from [facility] after a new diagnosis of COPD, pulmonary hypertension and a new diagnosis of CHF.   Antibiotics:   None  Cultures/Sepsis Markers:   Blood 4/25 >>  Urine 4/25 >>  Sputum 4/25 >>  Access/Protocols:  4/25 ETT >>  Best Practice: DVT: SQ hep GI: Protonix  Subjective: No events overnight.  Physical Exam: Filed Vitals:   12/17/11 0751  BP: 113/52  Pulse: 78  Temp:   Resp:     Intake/Output Summary (Last 24 hours) at 12/17/11 0919 Last data filed at 12/17/11 0800  Gross per 24 hour  Intake   9.61 ml  Output    770 ml  Net -760.39 ml   Vent Mode:  [-] PRVC FiO2 (%):  [39.7 %-70.2 %] 40 % Set Rate:  [14 bmp] 14 bmp Vt Set:  [400 mL] 400 mL PEEP:  [4.5 cmH20-5 cmH20] 5 cmH20 Plateau Pressure:  [0 cmH20-17 cmH20] 0 cmH20  Neuro: Sedated and intubated. Cardiac: RRR, Nl S1/S2, -M/R/G. Pulmonary: Diffuse crackles. GI: Soft, NT, ND and +BS. Extremities: 2+ edema and -tenderness.  Labs: CBC    Component Value Date/Time   WBC 15.1* 12/17/2011 0650   RBC 4.04 12/17/2011 0650   HGB 11.8* 12/17/2011 0650   HCT 36.3 12/17/2011 0650   PLT 218 12/17/2011 0650   MCV 89.9 12/17/2011 0650   MCH 29.2 12/17/2011 0650   MCHC 32.5 12/17/2011 0650   RDW 15.5 12/17/2011 0650   BMET      Component Value Date/Time   NA 139 12/17/2011 0650   K 4.3 12/17/2011 0650   CL 96 12/17/2011 0650   CO2 34* 12/17/2011 0650   GLUCOSE 149* 12/17/2011 0650   BUN 30* 12/17/2011 0650   CREATININE 1.34* 12/17/2011 0650   CALCIUM 9.1 12/17/2011 0650   GFRNONAA 38* 12/17/2011 0650   GFRAA 44* 12/17/2011 0650   ABG    Component Value Date/Time   PHART 7.401* 12/17/2011 0639   PCO2ART 56.1* 12/17/2011 0639   PO2ART 236.0* 12/17/2011 0639   HCO3 34.8* 12/17/2011 0639   TCO2 37 12/17/2011 0639   O2SAT 100.0 12/17/2011 0639   Lab 12/17/11 0650  MG 2.2   Lab Results  Component Value Date   CALCIUM 9.1 12/17/2011   PHOS 4.9* 12/17/2011   Chest Xray: Diffuse crackles.  Assessment & Plan: 78 year old female with PMH of ?COPD and CHF presenting with VDRF due to CHF and COPD.    Neuro: maintain sedated while intubated.  Pulmonary: COPD and CHF. Plan: Diurese aggressively today.  Monitor K and replace as needed.  Titrate O2 for sat of 88-92%.  Increase combivent.  Solumedrol at 40 mg IV q6 hours.  Continue abx.  Cardiac: CHF by history, positive troponins. Plan: 2D echo.  EKG  If troponins are still rising will call cards as the patient likely has demand ischemia right now and no evidence of STEMI.  ASA.  GI: No active issues. Plan: Start TF.  Renal: Borderline renal function with CHF. Plan: Lasix as ordered.  F/U BMET.  Endocrine: ISS  ID: RLL infiltrate. Plan: Vanc/zosyn.  F/U on cultures.  The patient is critically ill with multiple organ systems failure and requires high complexity decision making for assessment and support, frequent evaluation and titration of therapies, application of advanced monitoring technologies and extensive interpretation of multiple databases. Critical Care Time devoted to patient care services described in this note is 45 minutes.  Koren Bound, MD [phone]

## 2011-12-17 NOTE — Progress Notes (Signed)
Pt increased HR to 130-140s, all other VSS. EKG done. Pt now in A. Fib with RVR, Elink MD made aware. New orders received and followed. Will continue to monitor closely. At 2100 Cardiology also made aware of change in pts HR and rhythm, new orders received followed.

## 2011-12-17 NOTE — Progress Notes (Signed)
Pt Hr on monitor back to 70s, all VSS, EKG done, pt spontaneously converted from A. Fib to NSR. Cardizem gtt titrated down to off. Will continue to monitor closely.

## 2011-12-18 ENCOUNTER — Inpatient Hospital Stay (HOSPITAL_COMMUNITY): Payer: Medicare Other

## 2011-12-18 DIAGNOSIS — I2789 Other specified pulmonary heart diseases: Secondary | ICD-10-CM

## 2011-12-18 DIAGNOSIS — J449 Chronic obstructive pulmonary disease, unspecified: Secondary | ICD-10-CM

## 2011-12-18 DIAGNOSIS — I4891 Unspecified atrial fibrillation: Secondary | ICD-10-CM

## 2011-12-18 LAB — CBC
HCT: 31.3 % — ABNORMAL LOW (ref 36.0–46.0)
Hemoglobin: 10.1 g/dL — ABNORMAL LOW (ref 12.0–15.0)
MCHC: 32.3 g/dL (ref 30.0–36.0)
MCV: 89.2 fL (ref 78.0–100.0)
RDW: 15.6 % — ABNORMAL HIGH (ref 11.5–15.5)
WBC: 13.3 10*3/uL — ABNORMAL HIGH (ref 4.0–10.5)

## 2011-12-18 LAB — BLOOD GAS, ARTERIAL
Acid-Base Excess: 10.3 mmol/L — ABNORMAL HIGH (ref 0.0–2.0)
Drawn by: 31288
FIO2: 0.4 %
MECHVT: 400 mL
O2 Saturation: 99 %
PEEP: 5 cmH2O
Patient temperature: 98.6
RATE: 14 resp/min
pO2, Arterial: 117 mmHg — ABNORMAL HIGH (ref 80.0–100.0)

## 2011-12-18 LAB — BASIC METABOLIC PANEL
BUN: 43 mg/dL — ABNORMAL HIGH (ref 6–23)
Chloride: 91 mEq/L — ABNORMAL LOW (ref 96–112)
Creatinine, Ser: 1.57 mg/dL — ABNORMAL HIGH (ref 0.50–1.10)
GFR calc Af Amer: 36 mL/min — ABNORMAL LOW (ref >90)
Glucose, Bld: 161 mg/dL — ABNORMAL HIGH (ref 70–99)

## 2011-12-18 LAB — LIPID PANEL
Cholesterol: 160 mg/dL (ref 0–200)
LDL Cholesterol: 88 mg/dL (ref 0–99)
Total CHOL/HDL Ratio: 2.9 RATIO
VLDL: 16 mg/dL (ref 0–40)

## 2011-12-18 LAB — CULTURE, BLOOD (ROUTINE X 2)

## 2011-12-18 LAB — GLUCOSE, CAPILLARY
Glucose-Capillary: 108 mg/dL — ABNORMAL HIGH (ref 70–99)
Glucose-Capillary: 121 mg/dL — ABNORMAL HIGH (ref 70–99)
Glucose-Capillary: 131 mg/dL — ABNORMAL HIGH (ref 70–99)
Glucose-Capillary: 167 mg/dL — ABNORMAL HIGH (ref 70–99)

## 2011-12-18 LAB — URINE CULTURE: Colony Count: NO GROWTH

## 2011-12-18 MED ORDER — POTASSIUM CHLORIDE CRYS ER 20 MEQ PO TBCR
EXTENDED_RELEASE_TABLET | ORAL | Status: AC
  Administered 2011-12-18: 40 meq
  Filled 2011-12-18: qty 2

## 2011-12-18 MED ORDER — IPRATROPIUM-ALBUTEROL 18-103 MCG/ACT IN AERO
4.0000 | INHALATION_SPRAY | RESPIRATORY_TRACT | Status: DC
  Administered 2011-12-18 – 2011-12-19 (×4): 4 via RESPIRATORY_TRACT
  Filled 2011-12-18: qty 14.7

## 2011-12-18 MED ORDER — FOOD THICKENER (THICKENUP CLEAR)
ORAL | Status: DC | PRN
  Filled 2011-12-18: qty 120

## 2011-12-18 MED ORDER — POTASSIUM CHLORIDE 20 MEQ/15ML (10%) PO LIQD
40.0000 meq | Freq: Three times a day (TID) | ORAL | Status: AC
  Administered 2011-12-18 (×2): 40 meq
  Filled 2011-12-18 (×3): qty 30

## 2011-12-18 MED ORDER — FUROSEMIDE 10 MG/ML IJ SOLN
40.0000 mg | Freq: Three times a day (TID) | INTRAMUSCULAR | Status: AC
  Administered 2011-12-18 (×2): 40 mg via INTRAVENOUS
  Filled 2011-12-18 (×2): qty 4

## 2011-12-18 MED FILL — Nitroglycerin IV Soln 200 MCG/ML in D5W: INTRAVENOUS | Qty: 250 | Status: AC

## 2011-12-18 NOTE — Evaluation (Signed)
Clinical/Bedside Swallow Evaluation Patient Details  Name: Lynn Evans MRN: [id number] DOB: 08-02-1934 Today's Date: 12/18/2011  Past Medical History:  Past Medical History  Diagnosis Date  . CHF (congestive heart failure)   . COPD (chronic obstructive pulmonary disease)    Past Surgical History: History reviewed. No pertinent past surgical history. HPI:  Pt is a 78 year old female admitted with SOB, with new diagnosis of COPD, CHF. Pt was intubated for one day. CXR shows opacities consistent with CHF though infiltrate could have similar appearance. Pt admits to GER.   Assessment/Recommendations/Treatment Plan    SLP Assessment Clinical Impression Statement: Pt presents with overt signs of sensed aspiration with thin liquids in absence of overt weakness or delay. Suspect an acute reversible dysphagia due to edema from intubation and reduced airway protection during the swallow. Pt likely to improve given more time. Pt and family are anxious for PO intake, pt is safe to initiate a puree diet with honey thick liquids. SLP will f/u in one day for potential upgrade.  Risk for Aspiration: Moderate  Swallow Evaluation Recommendations Diet Recommendations: Dysphagia 1 (Puree);Honey-thick liquid Liquid Administration via: Cup;Spoon Medication Administration: Crushed with puree Supervision: Patient able to self feed Compensations: Slow rate;Small sips/bites Postural Changes and/or Swallow Maneuvers: Seated upright 90 degrees Follow up Recommendations: None  Treatment Plan Treatment Plan Recommendations: Therapy as outlined in treatment plan below Speech Therapy Frequency: min 2x/week Treatment Duration: 2 weeks Interventions: Aspiration precaution training;Patient/family education;Trials of upgraded texture/liquids;Diet toleration management by SLP  Prognosis Prognosis for Safe Diet Advancement: Good  Individuals Consulted Consulted and Agree with Results and Recommendations:  Patient;Family member/caregiver;MD;RN Family Member Consulted: Daughter  Swallowing Goals  SLP Swallowing Goals Patient will consume recommended diet without observed clinical signs of aspiration with: Modified independent assistance Patient will utilize recommended strategies during swallow to increase swallowing safety with: Minimal cueing Goal #3: Pt will consume trials of thin liquids without overt s/s of aspiration for possible diet upgrade.   Harlon Ditty, MA CCC-SLP [phone]  Claudine Mouton 12/18/2011,12:22 PM

## 2011-12-18 NOTE — Clinical Documentation Improvement (Signed)
RENAL FAILURE DOCUMENTATION CLARIFICATION QUERY  THIS DOCUMENT IS NOT A PERMANENT PART OF THE MEDICAL RECORD  TO RESPOND TO THE THIS QUERY, FOLLOW THE INSTRUCTIONS BELOW:  1. If needed, update documentation for the patient's encounter via the notes activity.  2. Access this query again and click edit on the In [employer].  3. After updating, or not, click F2 to complete all highlighted (required) fields concerning your review. Select "additional documentation in the medical record" OR "no additional documentation provided".  4. Click Sign note button.  5. The deficiency will fall out of your In Basket *Please let us know if you are not able to complete this workflow by phone or e-mail (listed below).  Please update your documentation within the medical record to reflect your response to this query.                                                                                    12/18/11  Dear Dr.Katz  In a better effort to capture your patient's severity of illness, reflect appropriate length of stay and utilization of resources, a review of the patient medical record has revealed the following indicators. Based on your clinical judgment, please clarify and document in a progress note and/or discharge summary the clinical condition associated with the following supporting information: In responding to this query please exercise your independent judgment.  The fact that a query is asked, does not imply that any particular answer is desired or expected.  Possible Clinical Conditions?   Acute Renal Failure/Acute Kidney Injury  Acute Tubular Necrosis  Other Condition  Cannot Clinically Determine     Supporting Information:  Risk Factors: diuretics   Signs and Symptoms: acute renal insufficiency per progress notes.   Diagnostics: -Lab:  -Current Creatinine Level: 1.34 4/25 1.57 4/26  -Current BUN Level: 30 4/25 43 4/26  -Electrolytes:K+:3.5 -GFR: 36-44  Treatments:  per progress note: Patient's BUN and creatinine are climbing slightly. These can be followed without pushing or diuresis any further as of this morning  You may use possible, probable, or suspect with inpatient documentation. possible, probable, suspected diagnoses MUST be documented at the time of discharge  Reviewed:  no additional documentation provided [facility]. No response. closed  Thank You,  Amada Kingfisher RN, BSN, CCM  Clinical Documentation Specialist: [phone] [email or web address]   Health Information Management [place]

## 2011-12-18 NOTE — Progress Notes (Signed)
PT Cancellation Note  Treatment cancelled due to patient extubated at 10:30 a.m. Protocol is to wait 4 hours post-extubation prior to initiating activity/OOB. Will follow.Marland Kitchen  Lynn Evans 12/18/2011, 11:55 AM Pager [phone]

## 2011-12-18 NOTE — Progress Notes (Signed)
Patient ID: Lynn Evans, female   DOB: 16-Aug-1934, 78 y.o.   MRN: [id number]   SUBJECTIVE:The patient remains intubated. Last evening she developed rapid atrial fibrillation. Cardizem was started. Following this patient was started on IV amiodarone and Cardizem was stopped. She converted to sinus rhythm.   Filed Vitals:   12/18/11 0400 12/18/11 0500 12/18/11 0600 12/18/11 0700  BP: 105/44 116/45 128/50 105/45  Pulse: 63 65 74 60  Temp:      TempSrc:      Resp: 16 17  21   Height:      Weight:   104 lb 8 oz (47.4 kg)   SpO2: 100% 100% 100% 100%    Intake/Output Summary (Last 24 hours) at 12/18/11 0733 Last data filed at 12/18/11 0600  Gross per 24 hour  Intake 1747.5 ml  Output   2895 ml  Net -1147.5 ml    LABS: Basic Metabolic Panel:  Basename 12/18/11 0530 12/17/11 0650  NA 136 139  K 3.5 4.3  CL 91* 96  CO2 34* 34*  GLUCOSE 161* 149*  BUN 43* 30*  CREATININE 1.57* 1.34*  CALCIUM 8.7 9.1  MG 2.2 2.2  PHOS 4.6 4.9*   Liver Function Tests:  [facility] 12/17/11 0650  AST 41*  ALT 30  ALKPHOS 63  BILITOT 0.4  PROT 6.3  ALBUMIN 3.3*    Basename 12/17/11 0650  LIPASE 19  AMYLASE --   CBC:  Basename 12/18/11 0530 12/17/11 0650  WBC 13.3* 15.1*  NEUTROABS -- --  HGB 10.1* 11.8*  HCT 31.3* 36.3  MCV 89.2 89.9  PLT 210 218   Cardiac Enzymes:  Basename 12/17/11 2149 12/17/11 1330 12/17/11 0650  CKTOTAL 109 157 218*  CKMB 4.0 6.8* 9.1*  CKMBINDEX -- -- --  TROPONINI <0.30 0.50* 0.70*   BNP: No components found with this basename: POCBNP:3 D-Dimer: No results found for this basename: DDIMER:2 in the last 72 hours Hemoglobin A1C:  Basename 12/17/11 0650  HGBA1C 5.7*   Fasting Lipid Panel:  Basename 12/18/11 0530  CHOL 160  HDL 56  LDLCALC 88  TRIG 79  CHOLHDL 2.9  LDLDIRECT --   Thyroid Function Tests: No results found for this basename: TSH,T4TOTAL,FREET3,T3FREE,THYROIDAB in the last 72 hours  RADIOLOGY: Dg Chest Port 1  View  12/17/2011  *RADIOLOGY REPORT*  Clinical Data: Intubation.  PORTABLE CHEST - 1 VIEW  Comparison: None.  Findings: 0621 hours.  Endotracheal tube is in the mid trachea, 3.1 cm above the carina.  A nasogastric tube projects below the diaphragm.  There are bilateral perihilar airspace opacities associated with poor definition of the pulmonary vasculature and bilateral pleural effusions.  No pneumothorax is identified.  The heart size is normal.  There is aortic atherosclerosis. Degenerative changes are seen within the lower thoracic spine.  IMPRESSION:  1.  Endotracheal tube and nasogastric tube appear well positioned. 2.  Pulmonary edema with bilateral pleural effusions most consistent with congestive heart failure.  Aspiration could have a similar appearance.  Original Report Authenticated By: Gerrianne Scale, M.D.    PHYSICAL EXAM Patient is intubated and sedated. She is frail. Lungs reveal scattered rhonchi. Cardiac exam reveals S1 and S2. The abdomen is soft. There is no significant peripheral edema.   TELEMETRY: I have reviewed telemetry. There is normal sinus rhythm.   ASSESSMENT AND PLAN:   *Acute respiratory failure with hypercapnia    Patient remains intubated and sedated.   Acute on chronic diastolic congestive heart failure   On admission  the patient's chest x-ray suggested CHF. We know she has good left ventricular function. It is possible that her acute diastolic dysfunction may have been related to her overall acute illness. She has diaries nicely. However BUN and creatinine are going up. She received diuretics yesterday but there are no ongoing diuretic orders. I agree that we should not push diuresis any further as of this morning.   Acute renal insufficiency    Patient's BUN and creatinine are climbing slightly. These can be followed without pushing or diuresis any further as of this morning.   Hypertensive urgency     Blood pressure is controlled at this time.   Atrial  fibrillation   Patient developed rapid atrial fibrillation last evening. She was treated with IV amiodarone and she converted to sinus rhythm. Plan to continue IV amiodarone for the next day or 2 watching her rhythm. As she stabilizes will make further decisions about the approach to this very limited episode of atrial fibrillation.   Willa Rough 12/18/2011 7:33 AM

## 2011-12-18 NOTE — Progress Notes (Signed)
HPI:  The patient is a 78 YO woman who comes as a transfer from [facility] hospital. She has recent diagnosis of COPD and CHF (dialstolic, EF 60-65%), who was seen by home health nurse and noted to have increased SOB and increased blood pressure. She was moved to the ED at [facility] and in respiratory distress and put on BIPAP while ABG was obtained. Pt was acidotic and was intubated. She was started on nitro drip for her hypertensive which caused her to become slightly hypotensive. She was maintained on the nitro drip throughout transfer to [facility] and her BP did come up slightly. She had not been taking her HF meds at home the past several days.  She had recently been discharged from [facility] after a new diagnosis of COPD, pulmonary hypertension and a new diagnosis of CHF.   Antibiotics:   Vancomycin 4/25>>> Zosyn 4/25>>>  Cultures/Sepsis Markers:   Blood 4/25 >>  Urine 4/25 >>  Sputum 4/25 >>  Access/Protocols:  4/25 ETT >>4/26  Best Practice: DVT: SQ hep GI: Protonix  Subjective: No events overnight.  Physical Exam: Filed Vitals:   12/18/11 0900  BP: 157/58  Pulse: 77  Temp:   Resp: 22    Intake/Output Summary (Last 24 hours) at 12/18/11 1020 Last data filed at 12/18/11 0900  Gross per 24 hour  Intake 1784.09 ml  Output   1835 ml  Net -50.91 ml   Vent Mode:  [-] CPAP FiO2 (%):  [39.7 %-40.4 %] 39.8 % Set Rate:  [14 bmp] 14 bmp Vt Set:  [400 mL] 400 mL PEEP:  [5 cmH20] 5 cmH20 Pressure Support:  [5 cmH20] 5 cmH20 Plateau Pressure:  [0 cmH20-15 cmH20] 15 cmH20  Neuro: Awake and intubated. Cardiac: RRR, Nl S1/S2, -M/R/G. Pulmonary: Diffuse crackles. GI: Soft, NT, ND and +BS. Extremities: 1+ edema and -tenderness.  Labs: CBC    Component Value Date/Time   WBC 13.3* 12/18/2011 0530   RBC 3.51* 12/18/2011 0530   HGB 10.1* 12/18/2011 0530   HCT 31.3* 12/18/2011 0530   PLT 210 12/18/2011 0530   MCV 89.2 12/18/2011 0530   MCH 28.8 12/18/2011 0530   MCHC  32.3 12/18/2011 0530   RDW 15.6* 12/18/2011 0530   BMET    Component Value Date/Time   NA 136 12/18/2011 0530   K 3.5 12/18/2011 0530   CL 91* 12/18/2011 0530   CO2 34* 12/18/2011 0530   GLUCOSE 161* 12/18/2011 0530   BUN 43* 12/18/2011 0530   CREATININE 1.57* 12/18/2011 0530   CALCIUM 8.7 12/18/2011 0530   GFRNONAA 31* 12/18/2011 0530   GFRAA 36* 12/18/2011 0530   ABG    Component Value Date/Time   PHART 7.419* 12/18/2011 0300   PCO2ART 55.2* 12/18/2011 0300   PO2ART 117.0* 12/18/2011 0300   HCO3 35.1* 12/18/2011 0300   TCO2 36.8 12/18/2011 0300   O2SAT 99.0 12/18/2011 0300   Lab 12/18/11 0530  MG 2.2   Lab Results  Component Value Date   CALCIUM 8.7 12/18/2011   PHOS 4.6 12/18/2011   Chest Xray: Diffuse crackles.  Assessment & Plan: 78 year old female with PMH of ?COPD and CHF presenting with VDRF due to CHF and COPD.    Neuro: D/C sedation.  Pulmonary: COPD and CHF. Plan: Diurese again today.  Monitor K and replace as needed.  Titrate O2 for sat of 88-92%.  Increase combivent.  Solumedrol at 40 mg IV q6 hours.  Continue abx.  Cardiac: CHF by history, positive troponins.  Plan: 2D echo pending.  Cardiology input appreciated.  GI: No active issues. Plan: D/C TF.  Swallow evaluation.  Renal: Borderline renal function with CHF. Plan: Lasix as ordered.  F/U BMET.  Replace K.  Endocrine: ISS  ID: RLL infiltrate. Plan: Vanc/zosyn.  F/U on cultures.  Spoke with the family bedside, they are sure patient would not want long term support but are holding on decision making until the other four siblings arrive (there are seven of them) which is reasonable.  For now continue as full code.  The patient is critically ill with multiple organ systems failure and requires high complexity decision making for assessment and support, frequent evaluation and titration of therapies, application of advanced monitoring technologies and extensive interpretation of multiple databases.  Critical Care Time devoted to patient care services described in this note is 45 minutes.  Koren Bound, MD [phone]

## 2011-12-18 NOTE — Procedures (Signed)
Extubation Procedure Note Pt tolerated extubation well. Pt extubated to 4LNC. All vitals are stable.   Patient Details:   Name: Lynn Evans DOB: 11-13-1933 MRN: [id number]   Airway Documentation:  Airway (Active)  Secured at (cm) 22 cm 12/18/2011  8:34 AM  Measured From Lips 12/18/2011  8:34 AM  Secured Location Center 12/18/2011  8:34 AM  Secured By [employer] 12/18/2011  8:34 AM  Tube Holder Repositioned Yes 12/18/2011  8:34 AM  Cuff Pressure (cm H2O) 30 cm H2O 12/18/2011  8:34 AM  Site Condition Dry 12/17/2011  8:00 AM    Evaluation  O2 sats: stable throughout Complications: No apparent complications Patient did tolerate procedure well. Bilateral Breath Sounds: Clear;Diminished Suctioning: Airway Yes: able to say name and oriented to place.  Devra Dopp [facility] 12/18/2011, 10:39 AM

## 2011-12-19 DIAGNOSIS — I059 Rheumatic mitral valve disease, unspecified: Secondary | ICD-10-CM

## 2011-12-19 DIAGNOSIS — N289 Disorder of kidney and ureter, unspecified: Secondary | ICD-10-CM

## 2011-12-19 DIAGNOSIS — J9600 Acute respiratory failure, unspecified whether with hypoxia or hypercapnia: Secondary | ICD-10-CM

## 2011-12-19 DIAGNOSIS — I10 Essential (primary) hypertension: Secondary | ICD-10-CM

## 2011-12-19 DIAGNOSIS — I5033 Acute on chronic diastolic (congestive) heart failure: Secondary | ICD-10-CM

## 2011-12-19 LAB — POCT I-STAT 3, ART BLOOD GAS (G3+)
pCO2 arterial: 50.2 mmHg — ABNORMAL HIGH (ref 35.0–45.0)
pH, Arterial: 7.47 — ABNORMAL HIGH (ref 7.350–7.400)
pO2, Arterial: 86 mmHg (ref 80.0–100.0)

## 2011-12-19 LAB — GLUCOSE, CAPILLARY
Glucose-Capillary: 170 mg/dL — ABNORMAL HIGH (ref 70–99)
Glucose-Capillary: 203 mg/dL — ABNORMAL HIGH (ref 70–99)
Glucose-Capillary: 137 mg/dL — ABNORMAL HIGH (ref 70–99)

## 2011-12-19 LAB — MAGNESIUM: Magnesium: 2 mg/dL (ref 1.5–2.5)

## 2011-12-19 LAB — CULTURE, RESPIRATORY W GRAM STAIN: Special Requests: NORMAL

## 2011-12-19 LAB — CBC
Hemoglobin: 10 g/dL — ABNORMAL LOW (ref 12.0–15.0)
MCHC: 33 g/dL (ref 30.0–36.0)
RDW: 15.8 % — ABNORMAL HIGH (ref 11.5–15.5)

## 2011-12-19 LAB — BASIC METABOLIC PANEL
GFR calc Af Amer: 34 mL/min — ABNORMAL LOW (ref >90)
GFR calc non Af Amer: 30 mL/min — ABNORMAL LOW (ref >90)
Potassium: 3.9 mEq/L (ref 3.5–5.1)
Sodium: 142 mEq/L (ref 135–145)

## 2011-12-19 LAB — PHOSPHORUS: Phosphorus: 3.4 mg/dL (ref 2.3–4.6)

## 2011-12-19 MED ORDER — IPRATROPIUM BROMIDE 0.02 % IN SOLN
0.5000 mg | Freq: Four times a day (QID) | RESPIRATORY_TRACT | Status: DC
  Administered 2011-12-19 – 2011-12-20 (×4): 0.5 mg via RESPIRATORY_TRACT
  Filled 2011-12-19 (×5): qty 2.5

## 2011-12-19 MED ORDER — DILTIAZEM HCL 60 MG PO TABS
60.0000 mg | ORAL_TABLET | Freq: Three times a day (TID) | ORAL | Status: DC
  Administered 2011-12-19 – 2011-12-21 (×6): 60 mg via ORAL
  Filled 2011-12-19 (×9): qty 1

## 2011-12-19 MED ORDER — BUDESONIDE 0.25 MG/2ML IN SUSP
0.2500 mg | Freq: Two times a day (BID) | RESPIRATORY_TRACT | Status: DC
  Administered 2011-12-19 – 2011-12-21 (×4): 0.25 mg via RESPIRATORY_TRACT
  Filled 2011-12-19 (×8): qty 2

## 2011-12-19 MED ORDER — ALBUTEROL SULFATE (5 MG/ML) 0.5% IN NEBU
2.5000 mg | INHALATION_SOLUTION | Freq: Four times a day (QID) | RESPIRATORY_TRACT | Status: DC
  Administered 2011-12-19 – 2011-12-20 (×4): 2.5 mg via RESPIRATORY_TRACT
  Filled 2011-12-19 (×5): qty 0.5

## 2011-12-19 MED ORDER — AMIODARONE HCL 200 MG PO TABS
400.0000 mg | ORAL_TABLET | Freq: Two times a day (BID) | ORAL | Status: DC
  Administered 2011-12-19 – 2011-12-20 (×3): 400 mg via ORAL
  Filled 2011-12-19 (×4): qty 2

## 2011-12-19 MED ORDER — FUROSEMIDE 10 MG/ML IJ SOLN
40.0000 mg | Freq: Once | INTRAMUSCULAR | Status: AC
  Administered 2011-12-19: 40 mg via INTRAVENOUS
  Filled 2011-12-19: qty 4

## 2011-12-19 NOTE — Progress Notes (Signed)
Lynn Bottoms, MD, [facility] ABIM Board Certified in Adult Cardiovascular Medicine,Internal Medicine and Critical Care Medicine    SUBJECTIVE:  S/p extubation sitting in chair, mild dyspnea, hoarse voice, no SCP, no N/V- feels much better   Principal Problem:  *Acute respiratory failure with hypercapnia Active Problems:  COPD (chronic obstructive pulmonary disease)  Acute on chronic diastolic congestive heart failure  HTN (hypertension)  Acute renal insufficiency  Hypertensive urgency  Pulmonary hypertension  Atrial fibrillation   Past Medical History  Diagnosis Date  . CHF (congestive heart failure)   . COPD (chronic obstructive pulmonary disease)     Current Facility-Administered Medications  Medication Dose Route Frequency Provider Last Rate Last Dose  . 0.9 %  sodium chloride infusion  250 mL Intravenous PRN Darnelle Maffucci, MD 4 mL/hr at 12/17/11 0956 250 mL at 12/17/11 0956  . 0.9 %  sodium chloride infusion  250 mL Intravenous PRN Lupita Leash, MD   250 mL at 12/19/11 0212  . 0.9 %  sodium chloride infusion   Intravenous PRN Lupita Leash, MD 5 mL/hr at 12/17/11 2042 5 mL/hr at 12/17/11 2042  . albuterol (PROVENTIL) (5 MG/ML) 0.5% nebulizer solution 2.5 mg  2.5 mg Nebulization Q4H PRN Darnelle Maffucci, MD      . ipratropium (ATROVENT) nebulizer solution 0.5 mg  0.5 mg Nebulization Q6H Kalman Shan, MD       And  . albuterol (PROVENTIL) (5 MG/ML) 0.5% nebulizer solution 2.5 mg  2.5 mg Nebulization Q6H Kalman Shan, MD      . amiodarone (NEXTERONE PREMIX) 360 mg/200 mL dextrose IV infusion  30 mg/hr Intravenous Continuous Dolores Patty, MD 16.7 mL/hr at 12/19/11 0627 30 mg/hr at 12/19/11 0627  . antiseptic oral rinse (BIOTENE) solution 15 mL  15 mL Mouth Rinse QID Lupita Leash, MD   15 mL at 12/19/11 0400  . aspirin EC tablet 81 mg  81 mg Oral Daily Alyson Reedy, MD   81 mg at 12/19/11 0953  . budesonide (PULMICORT) nebulizer solution 0.25 mg  0.25  mg Nebulization BID Kalman Shan, MD      . dextrose 10 % infusion   Intravenous PRN Lupita Leash, MD      . diltiazem (CARDIZEM) 100 mg in dextrose 5 % 100 mL infusion  5-15 mg/hr Intravenous Continuous Nyoka Cowden, MD   10 mg/hr at 12/17/11 2100  . food thickener (RESOURCE THICKENUP CLEAR) powder   Oral PRN Riley Nearing [place]blois, CCC-SLP      . furosemide (LASIX) injection 40 mg  40 mg Intravenous Q8H Alyson Reedy, MD   40 mg at 12/18/11 1727  . furosemide (LASIX) injection 40 mg  40 mg Intravenous Once Kalman Shan, MD   40 mg at 12/19/11 0954  . heparin injection 5,000 Units  5,000 Units Subcutaneous Q8H Darnelle Maffucci, MD   5,000 Units at 12/19/11 0505  . insulin aspart (novoLOG) injection 0-4 Units  0-4 Units Subcutaneous Q4H Darnelle Maffucci, MD   4 Units at 12/19/11 0759  . methylPREDNISolone sodium succinate (SOLU-MEDROL) 40 mg/mL injection 40 mg  40 mg Intravenous Q6H Alyson Reedy, MD   40 mg at 12/19/11 0953  . mulitivitamin liquid 5 mL  5 mL Per Tube Daily Tonye Becket, RD   5 mL at 12/19/11 0953  . pantoprazole sodium (PROTONIX) 40 mg/20 mL oral suspension 40 mg  40 mg Per Tube Q1200 Darnelle Maffucci, MD   40 mg at 12/18/11 1635  .  piperacillin-tazobactam (ZOSYN) IVPB 3.375 g  3.375 g Intravenous Q8H Severiano Gilbert, PHARMD   3.375 g at 12/19/11 0505  . pneumococcal 23 valent vaccine (PNU-IMMUNE) injection 0.5 mL  0.5 mL Intramuscular Tomorrow-1000 Alyson Reedy, MD   0.5 mL at 12/18/11 1640  . potassium chloride 20 MEQ/15ML (10%) liquid 40 mEq  40 mEq Per Tube TID Alyson Reedy, MD   40 mEq at 12/18/11 2212  . potassium chloride SA (K-DUR,KLOR-CON) 20 MEQ CR tablet        40 mEq at 12/18/11 1132  . sodium chloride 0.9 % injection 3 mL  3 mL Intravenous Q12H Darnelle Maffucci, MD   3 mL at 12/18/11 1000  . sodium chloride 0.9 % injection 3 mL  3 mL Intravenous PRN Darnelle Maffucci, MD      . vancomycin (VANCOCIN) 500 mg in sodium chloride 0.9 % 100 mL IVPB  500 mg  Intravenous Q24H Severiano Gilbert, PHARMD   500 mg at 12/18/11 1728  . DISCONTD: albuterol-ipratropium (COMBIVENT) inhaler 4 puff  4 puff Inhalation Q4H Alyson Reedy, MD   4 puff at 12/19/11 [phone]  . DISCONTD: albuterol-ipratropium (COMBIVENT) inhaler 6 puff  6 puff Inhalation Q4H Alyson Reedy, MD   6 puff at 12/18/11 1159  . DISCONTD: chlorhexidine (PERIDEX) 0.12 % solution 15 mL  15 mL Mouth Rinse BID Lupita Leash, MD   15 mL at 12/18/11 0745  . DISCONTD: feeding supplement (OSMOLITE 1.5 CAL) liquid 1,000 mL  1,000 mL Per Tube Q24H Tonye Becket, RD   1,000 mL at 12/17/11 1458  . DISCONTD: feeding supplement (PRO-STAT SUGAR FREE 64) liquid 30 mL  30 mL Per Tube Q1200 Tonye Becket, RD   30 mL at 12/17/11 1458    LABS: Basic Metabolic Panel:  Basename 12/19/11 0650 12/18/11 0530  NA 142 136  K 3.9 3.5  CL 95* 91*  CO2 35* 34*  GLUCOSE 137* 161*  BUN 49* 43*  CREATININE 1.63* 1.57*  CALCIUM 8.8 8.7  MG 2.0 2.2  PHOS 3.4 4.6    CBC:  Basename 12/19/11 0650 12/18/11 0530  WBC 14.1* 13.3*  NEUTROABS -- --  HGB 10.0* 10.1*  HCT 30.3* 31.3*  MCV 89.9 89.2  PLT 220 210    Cardiac Enzymes:  Basename 12/17/11 2149 12/17/11 1330 12/17/11 0650  CKTOTAL 109 157 218*  CKMB 4.0 6.8* 9.1*  CKMBINDEX -- -- --  TROPONINI <0.30 0.50* 0.70*    BNP (last 3 results)  Basename 12/17/11 0650  PROBNP 29375.0*    RADIOLOGY: Dg Chest Port 1 View  12/18/2011  *RADIOLOGY REPORT*  Clinical Data: Endotracheal tube placement  PORTABLE CHEST - 1 VIEW  Comparison: Chest radiograph 04/25/ 2013  Findings: Endotracheal tube and NG tube are unchanged.  Stable heart silhouette.  Bilateral pleural effusions and basilar atelectasis are similar to prior.  There is some improvement in air space disease in the left lower lobe.  Right lower lobe similar. No pneumothorax.  IMPRESSION: 1.  Mild improvement in bibasilar air space disease.  2.  Persistent bilateral effusions and basilar  atelectasis.  Original Report Authenticated By: Genevive Bi, M.D.      PHYSICAL EXAM  Filed Vitals:   12/19/11 0500 12/19/11 0600 12/19/11 0700 12/19/11 0828  BP: 154/53 152/74 158/60   Pulse: 81 80 82   Temp:      TempSrc:      Resp: 22 22 21    Height:  Weight:      SpO2: 98% 98% 97% 98%   BP 158/60  Pulse 82  Temp(Src) 97.6 F (36.4 C) (Oral)  Resp 21  Ht 5\' 4"  (1.626 m)  Wt 103 lb 2.8 oz (46.8 kg)  BMI 17.71 kg/m2  SpO2 98%  Intake/Output Summary (Last 24 hours) at 12/19/11 1053 Last data filed at 12/19/11 1610  Gross per 24 hour  Intake 917.33 ml  Output   2270 ml  Net -1352.67 ml   I/O last 3 completed shifts: In: 2090.7 [P.O.:120; I.V.:1140.2; NG/GT:510; IV Piggyback:320.5] Out: 3005 [Urine:3005] General: malnourished HEENT: normal carotid upstroke. Normal JVP. No thyromegaly Cardiac: RRR, NL S1/S2. No pathologic murmurs Lungs: decreased BS with diffuse rhonchi Abdomen, soft, non- tender Extremities. No edema. Normal distal pulses Skin: warm and dry Psychologic: normal affect.   Bedside ECHO : NL LVEF 60% , biatrial enlargement with mild to moderate MR  TELEMETRY: Reviewed telemetry pt in NSR ASSESSMENT AND PLAN:  1. Acute respiratory failure with hypercapnia   2. Chronic diastolic congestive heart failure   3. COPD (chronic obstructive pulmonary disease)   4. HTN (hypertension)   5. Acute on chronic diastolic congestive heart failure   6. Atrial fibrillation   7. Acute renal insufficiency   8. Hypertensive urgency   9. Pulmonary hypertension     Patient Active Problem List  Diagnoses  . Acute respiratory failure with hypercapnia  . COPD (chronic obstructive pulmonary disease)  . Acute on chronic diastolic congestive heart failure- ECG negative for ischemia  . HTN (hypertension)  . Acute renal insufficiency  . Hypertensive urgency  . Pulmonary hypertension  . Atrial fibrillation - paroxysmal but suspect patient had this  before Markedly elevated BNP level associated  with significant biatrial enlargement  By bedside ECHO today c/w severe diastolic dysfucntion - susopect grade III or IV     PLAN   Change to PO amiodarone 400mg  PO BID- will need to consider alternative agent in future given lung disease  2 D ECHO to measure intracardiac pressures and diastolic assessment ? Pulmonary hypertension - severe biatrial enlargement by bedside ECHO today  Agree with diuresis for now.  Needs long term anticoagulation but patient currently declines.    Alvin Critchley [facility] 12/19/2011, 10:53 AM

## 2011-12-19 NOTE — Progress Notes (Signed)
  Echocardiogram 2D Echocardiogram has been performed.  Jorje Guild [facility] 12/19/2011, 3:11 PM

## 2011-12-19 NOTE — Progress Notes (Signed)
Speech Language Pathology Dysphagia Treatment  Patient Details Name: Lynn Evans MRN: [id number] DOB: Nov 11, 1933 Today's Date: 12/19/2011  SLP Assessment/Plan/Recommendation Assessment / Recommendations / Plan Clinical Impression Statement: Pt continues to present with overt s/s of aspiration with large straw sips of water only. Pts with appearance of timely strong swallow otherwise. Pt may initiate thin liquids and a dysphagia 3 diet following aspiration precautions. SLP will f/u on Monday. If signs of aspiraiton persist at that time, will consider objective testing.  Initiate / Change Diet: Dysphagia 3 (mechanical soft);Thin liquid Liquids provided via: Cup;No straw Medication Administration: Whole meds with puree Supervision: Patient able to self feed;Intermittent supervision to cue for compensatory strategies Compensations: Slow rate;Small sips/bites Postural Changes and/or Swallow Maneuvers: Seated upright 90 degrees;Out of bed for meals Plan: Continue with current plan of care Swallowing Goals  SLP Swallowing Goals Patient will consume recommended diet without observed clinical signs of aspiration with: Modified independent assistance Patient will utilize recommended strategies during swallow to increase swallowing safety with: Supervision/safety Swallow Study Goal #2 - Progress: Progressing toward goal Goal #3: Pt will consume trials of thin liquids without overt s/s of aspiration for possible diet upgrade.  Swallow Study Goal #3 - Progress: Met  General Respiratory Status: Supplemental O2 delivered via (comment) Behavior/Cognition: Alert;Cooperative;Pleasant mood Oral Cavity - Dentition: Adequate natural dentition Dysphagia Treatment Treatment focused on: Skilled observation of diet tolerance;Upgraded PO texture trials;Patient/family/caregiver education Family/Caregiver Educated: daughter, son Treatment Methods/Modalities: Skilled observation;Differential diagnosis Patient  observed directly with PO's: Yes Feeding: Able to feed self Liquids provided via: Cup;Straw Pharyngeal Phase Signs & Symptoms: Immediate cough Type of cueing: Verbal Amount of cueing: Minimal  Harlon Ditty, MA CCC-SLP [phone]  Claudine Mouton 12/19/2011, 9:09 AM

## 2011-12-19 NOTE — Evaluation (Signed)
Physical Therapy Evaluation Patient Details Name: Lynn Evans MRN: [id number] DOB: 01-24-1934 Today's Date: 12/19/2011 Time: 4098-1191 PT Time Calculation (min): 25 min  PT Assessment / Plan / Recommendation Clinical Impression  Pt is 78 y/o female recently intubated on 12/17/11 due to respiratory distress and extubated on 12/18/11.  Pt limited due to overall decrease mobility and endurance and would benefit from acute PT services to improve these deficits.    PT Assessment  Patient needs continued PT services    Follow Up Recommendations  Home health PT;Supervision/Assistance - 24 hour    Equipment Recommendations  None recommended by PT    Frequency Min 3X/week    Precautions / Restrictions Precautions Precautions: Fall   Pertinent Vitals/Pain 0/10      Mobility  Bed Mobility Bed Mobility: Supine to Sit Supine to Sit: 4: Min assist;With rails;HOB flat Details for Bed Mobility Assistance: (A) to elevate trunk OOB with cues for technique Transfers Transfers: Sit to Stand;Stand to Sit Sit to Stand: 4: Min assist;With armrests;From bed;From elevated surface Stand to Sit: 4: Min assist;With armrests;To chair/3-in-1 Details for Transfer Assistance: (A) to initiate transfer and slowly descend to recliner with cues for hand placement. Ambulation/Gait Ambulation/Gait Assistance: 1: +2 Total assist Ambulation/Gait: Patient Percentage: 70 Ambulation Distance (Feet): 15 Feet Assistive device: 2 person hand held assist Ambulation/Gait Assistance Details: (A) to maintain balance with cues for proper sequence Gait Pattern: Step-through pattern;Decreased step length - left;Decreased step length - right;Shuffle;Trunk flexed Stairs: No Wheelchair Mobility Wheelchair Mobility: No    Exercises     PT Goals Acute Rehab PT Goals PT Goal Formulation: With patient/family Time For Goal Achievement: 12/26/11 Potential to Achieve Goals: Good Pt will go Supine/Side to Sit: with modified  independence PT Goal: Supine/Side to Sit - Progress: Goal set today Pt will Sit at Edge of Bed: with modified independence;1-2 min PT Goal: Sit at [place] Of Bed - Progress: Goal set today Pt will go Sit to Supine/Side: with modified independence PT Goal: Sit to Supine/Side - Progress: Goal set today Pt will go Sit to Stand: with modified independence PT Goal: Sit to Stand - Progress: Goal set today Pt will go Stand to Sit: with modified independence PT Goal: Stand to Sit - Progress: Goal set today Pt will Transfer Bed to Chair/Chair to Bed: with modified independence PT Transfer Goal: Bed to Chair/Chair to Bed - Progress: Goal set today Pt will Ambulate: >150 feet;with modified independence PT Goal: Ambulate - Progress: Goal set today Pt will Go Up / Down Stairs: 3-5 stairs;with min assist;with least restrictive assistive device PT Goal: Up/Down Stairs - Progress: Goal set today  Visit Information  Last PT Received On: 12/19/11    Subjective Data  Subjective: "I'm ready to get out of bed.  Let's do it." Patient Stated Goal: To return home with family   Prior Functioning  Home Living Lives With: Alone Available Help at Discharge: Family Type of Home: House Home Access: Stairs to enter Secretary/administrator of Steps: 1 Entrance Stairs-Rails: None Home Layout: One level Bathroom Shower/Tub: Forensic scientist: Standard Bathroom Accessibility: Yes How Accessible: Accessible via walker Home Adaptive Equipment: Walker - rolling;Bedside commode/3-in-1;Straight cane;Wheelchair - manual Prior Function Level of Independence: Independent Able to Take Stairs?: Yes Driving: Yes Vocation: Retired Musician: No difficulties    Cognition  Overall Cognitive Status: Appears within functional limits for tasks assessed/performed Arousal/Alertness: Awake/alert Orientation Level: Oriented X4 / Intact    Extremity/Trunk Assessment Right Upper  Extremity Assessment RUE  ROM/Strength/Tone: Within functional levels Left Upper Extremity Assessment LUE ROM/Strength/Tone: Within functional levels Right Lower Extremity Assessment RLE ROM/Strength/Tone: Unable to fully assess (At least 3/5 gross for functional mobility) Left Lower Extremity Assessment LLE ROM/Strength/Tone: Unable to fully assess (At least 3/5 with functional mobility) Trunk Assessment Trunk Assessment: Normal   Balance Balance Balance Assessed: Yes Static Sitting Balance Static Sitting - Balance Support: Feet supported Static Sitting - Level of Assistance: 5: Stand by assistance Static Sitting - Comment/# of Minutes: ~ 5 minutes prior to transfer  End of Session PT - End of Session Equipment Utilized During Treatment: Gait belt;Oxygen (2L) Activity Tolerance: Patient limited by fatigue Patient left: in chair;with call bell/phone within reach;with family/visitor present Nurse Communication: Mobility status   Sabre Romberger 12/19/2011, 11:47 AM Jake Shark, PT DPT [phone]

## 2011-12-19 NOTE — Progress Notes (Addendum)
PCCM PROGRESS NOTES  HPI:  The patient is a 78 YO woman who comes as a transfer from [facility] hospital. She has recent diagnosis of COPD and CHF (dialstolic, EF 60-65%), who was seen by home health nurse and noted to have increased SOB and increased blood pressure. She was moved to the ED at [facility] and in respiratory distress and put on BIPAP while ABG was obtained. Pt was acidotic and was intubated. She was started on nitro drip for her hypertensive which caused her to become slightly hypotensive. She was maintained on the nitro drip throughout transfer to [facility] and her BP did come up slightly. She had not been taking her HF meds at home the past several days.  She had recently been discharged from [facility] after a new diagnosis of COPD, pulmonary hypertension and a new diagnosis of CHF.   Antibiotics:   Vancomycin 4/25>>> Zosyn 4/25>>>  Anti-infectives     Start     Dose/Rate Route Frequency Ordered Stop   12/18/11 1800   vancomycin (VANCOCIN) 500 mg in sodium chloride 0.9 % 100 mL IVPB        500 mg 100 mL/hr over 60 Minutes Intravenous Every 24 hours 12/17/11 1430     12/17/11 1600   vancomycin (VANCOCIN) 750 mg in sodium chloride 0.9 % 150 mL IVPB        750 mg 150 mL/hr over 60 Minutes Intravenous  Once 12/17/11 1430 12/17/11 1626   12/17/11 1500  piperacillin-tazobactam (ZOSYN) IVPB 3.375 g       3.375 g 12.5 mL/hr over 240 Minutes Intravenous 3 times per day 12/17/11 1427             Cultures/Sepsis Markers:   Blood 4/25 >>  Urine 4/25 >>  Sputum 4/25 >>  Results for orders placed during the hospital encounter of 12/17/11  MRSA PCR SCREENING     Status: Normal   Collection Time   12/17/11  6:02 AM      Component Value Range Status Comment   MRSA by PCR NEGATIVE  NEGATIVE  Final   URINE CULTURE     Status: Normal   Collection Time   12/17/11  6:50 AM      Component Value Range Status Comment   Specimen Description URINE, CATHETERIZED   Final      Special Requests NONE   Final    Culture  Setup Time [id number]   Final    Colony Count NO GROWTH   Final    Culture NO GROWTH   Final    Report Status 12/18/2011 FINAL   Final   CULTURE, BLOOD (ROUTINE X 2)     Status: Normal (Preliminary result)   Collection Time   12/17/11  7:40 AM      Component Value Range Status Comment   Specimen Description BLOOD RIGHT HAND   Final    Special Requests BOTTLES DRAWN AEROBIC ONLY 3CC   Final    Culture  Setup Time [id number]   Final    Culture     Final    Value:        BLOOD CULTURE RECEIVED NO GROWTH TO DATE CULTURE WILL BE HELD FOR 5 DAYS BEFORE ISSUING A FINAL NEGATIVE REPORT   Report Status PENDING   Incomplete   CULTURE, RESPIRATORY     Status: Normal (Preliminary result)   Collection Time   12/17/11  8:00 AM      Component Value Range Status Comment  Specimen Description TRACHEAL ASPIRATE   Final    Special Requests Normal   Final    Gram Stain     Final    Value: RARE WBC PRESENT,BOTH PMN AND MONONUCLEAR     NO SQUAMOUS EPITHELIAL CELLS SEEN     NO ORGANISMS SEEN   Culture Culture reincubated for better growth   Final    Report Status PENDING   Incomplete      Access/Protocols:  4/25 ETT >>4/26  Best Practice: DVT: SQ hep GI: Protonix  Subjective: dOING WELL Ppost extubation. Family (> 10) at bedside all cheerful. She is sitting an eating  Physical Exam: Filed Vitals:   12/19/11 0700  BP: 158/60  Pulse: 82  Temp:   Resp: 21    Intake/Output Summary (Last 24 hours) at 12/19/11 0929 Last data filed at 12/19/11 2440  Gross per 24 hour  Intake 973.03 ml  Output   2315 ml  Net -1341.97 ml   Vent Mode:  [-]  FiO2 (%):  [2 %] 2 %  Neuro: Awake and intubated. Sitting in chair. Talking. Eating. Cheerful. RASS +1. CAM-ICU neg for delirium Cardiac: RRR, Nl S1/S2, -M/R/G. Pulmonary: No wheeze or  Crackles. Barrell chested +.  GI: Soft, NT, ND and +BS. Extremities: NO edema and -tenderness. Psych: Cheerful Gen:   Cachectic  Labs: CBC    Component Value Date/Time   WBC 14.1* 12/19/2011 0650   RBC 3.37* 12/19/2011 0650   HGB 10.0* 12/19/2011 0650   HCT 30.3* 12/19/2011 0650   PLT 220 12/19/2011 0650   MCV 89.9 12/19/2011 0650   MCH 29.7 12/19/2011 0650   MCHC 33.0 12/19/2011 0650   RDW 15.8* 12/19/2011 0650   BMET    Component Value Date/Time   NA 142 12/19/2011 0650   K 3.9 12/19/2011 0650   CL 95* 12/19/2011 0650   CO2 35* 12/19/2011 0650   GLUCOSE 137* 12/19/2011 0650   BUN 49* 12/19/2011 0650   CREATININE 1.63* 12/19/2011 0650   CALCIUM 8.8 12/19/2011 0650   GFRNONAA 30* 12/19/2011 0650   GFRAA 34* 12/19/2011 0650   ABG    Component Value Date/Time   PHART 7.419* 12/18/2011 0300   PCO2ART 55.2* 12/18/2011 0300   PO2ART 117.0* 12/18/2011 0300   HCO3 35.1* 12/18/2011 0300   TCO2 36.8 12/18/2011 0300   O2SAT 99.0 12/18/2011 0300    Lab 12/19/11 0650  MG 2.0   Lab Results  Component Value Date   CALCIUM 8.8 12/19/2011   PHOS 3.4 12/19/2011   Chest Xray:    Dg Chest Port 1 View  12/18/2011  *RADIOLOGY REPORT*  Clinical Data: Endotracheal tube placement  PORTABLE CHEST - 1 VIEW  Comparison: Chest radiograph 04/25/ 2013  Findings: Endotracheal tube and NG tube are unchanged.  Stable heart silhouette.  Bilateral pleural effusions and basilar atelectasis are similar to prior.  There is some improvement in air space disease in the left lower lobe.  Right lower lobe similar. No pneumothorax.  IMPRESSION: 1.  Mild improvement in bibasilar air space disease.  2.  Persistent bilateral effusions and basilar atelectasis.  Original Report Authenticated By: Genevive Bi, M.D.     Lab 12/19/11 0650 12/18/11 0530 12/17/11 0650  NA 142 136 139  K 3.9 3.5 --  CL 95* 91* 96  CO2 35* 34* 34*  GLUCOSE 137* 161* 149*  BUN 49* 43* 30*  CREATININE 1.63* 1.57* 1.34*  CALCIUM 8.8 8.7 9.1  MG 2.0 2.2 2.2  PHOS 3.4 4.6  4.9*    Lab 12/17/11 0650  PROBNP 29375.0*     Lab 12/17/11 2149 12/17/11 1330 12/17/11  0650  TROPONINI <0.30 0.50* 0.70*     Lab 12/18/11 0300 12/17/11 0639  PHART 7.419* 7.401*  PCO2ART 55.2* 56.1*  PO2ART 117.0* 236.0*  HCO3 35.1* 34.8*  TCO2 36.8 37  O2SAT 99.0 100.0     Assessment & Plan: 78 year old female with PMH of ?COPD and CHF presenting with VDRF due to acute diastolic CHF due to Hypertensive urgency and AE- COPD.     Pulmonary: AECOPD and  Acute Diastolic CHF resulting in Acute on Respiratory Failure Plan: Diurese again toda; 40mg  IV lasix x 1 on 12/19/11  Monitor K and replace as needed.  Titrate O2 for sat of 88-92%.  Increase combivent.  Solumedrol at 40 mg IV q6 hoursl; change to po prednisone 12/20/11  Continue abx.  Check aBG  Do duoneb q6h + pulmicort neb q12h + albuterol prn - will need this at discharge  Will need spirometry prior to dc to classify copd risk (but appears to be high risk copd patient)  Cardiac: Acute Diastolic CHF by history, positive troponins. Due to hypertensive urgency Plan: 2D echo pending.  Cardiology input appreciated.  Need cardiology input on IV heparin  GI: No active issues. Plan: Oral feeds.  Renal: Borderline renal function with CHF. Acute renal failure - worsening creatinine Plan: Lasix as ordered; again on 4/27  F/U BMET.  Replace K.  Endocrine: ISS  ID: RLL infiltrate./ pneumonia Plan: Vanc/zosyn.  F/U on cultures.  GLOBAL  On 4/26: Dr Thurmond Butts: Spoke with the family bedside, they are sure patient would not want long term support but are holding on decision making until the other four siblings arrive (there are seven of them) which is reasonable.  For now continue as full code.  On 4/27: Dr Marchelle Gearing updaed family at bedside. No discussion about code status. Tx to SDU. Will remain on PCCM due to high risk COPD status      The patient is critically ill with multiple organ systems failure and requires high complexity decision making for assessment and support, frequent evaluation and titration of  therapies, application of advanced monitoring technologies and extensive interpretation of multiple databases.   Critical Care Time devoted to patient care services described in this note is  35  Minutes.  Dr. Kalman Shan, M.D., [facility].C.P Pulmonary and Critical Care Medicine Staff Physician [facility] [place] [facility] Pager: [phone], If no answer or between  15:00h - 7:00h: call [phone]  12/19/2011 9:41 AM

## 2011-12-20 LAB — GLUCOSE, CAPILLARY
Glucose-Capillary: 137 mg/dL — ABNORMAL HIGH (ref 70–99)
Glucose-Capillary: 132 mg/dL — ABNORMAL HIGH (ref 70–99)

## 2011-12-20 LAB — STREP PNEUMONIAE URINARY ANTIGEN: Strep Pneumo Urinary Antigen: NEGATIVE

## 2011-12-20 LAB — PRO B NATRIURETIC PEPTIDE: Pro B Natriuretic peptide (BNP): 9154 pg/mL — ABNORMAL HIGH (ref 0–450)

## 2011-12-20 MED ORDER — LEVALBUTEROL HCL 0.63 MG/3ML IN NEBU
0.6300 mg | INHALATION_SOLUTION | Freq: Four times a day (QID) | RESPIRATORY_TRACT | Status: DC
  Administered 2011-12-20 – 2011-12-21 (×3): 0.63 mg via RESPIRATORY_TRACT
  Filled 2011-12-20 (×7): qty 3

## 2011-12-20 MED ORDER — SENNA 8.6 MG PO TABS
1.0000 | ORAL_TABLET | Freq: Every day | ORAL | Status: DC | PRN
  Administered 2011-12-20: 8.6 mg via ORAL
  Filled 2011-12-20: qty 1

## 2011-12-20 MED ORDER — IPRATROPIUM BROMIDE 0.02 % IN SOLN
0.5000 mg | Freq: Four times a day (QID) | RESPIRATORY_TRACT | Status: DC
  Administered 2011-12-20 – 2011-12-21 (×3): 0.5 mg via RESPIRATORY_TRACT
  Filled 2011-12-20: qty 5
  Filled 2011-12-20 (×2): qty 2.5

## 2011-12-20 MED ORDER — IPRATROPIUM BROMIDE 0.02 % IN SOLN
0.5000 mg | RESPIRATORY_TRACT | Status: DC
  Administered 2011-12-20: 0.5 mg via RESPIRATORY_TRACT
  Filled 2011-12-20: qty 2.5

## 2011-12-20 MED ORDER — LEVALBUTEROL HCL 0.63 MG/3ML IN NEBU
0.6300 mg | INHALATION_SOLUTION | RESPIRATORY_TRACT | Status: DC
  Filled 2011-12-20 (×5): qty 3

## 2011-12-20 MED ORDER — PREDNISONE 20 MG PO TABS
40.0000 mg | ORAL_TABLET | Freq: Every day | ORAL | Status: DC
  Administered 2011-12-21: 40 mg via ORAL
  Filled 2011-12-20 (×2): qty 2

## 2011-12-20 MED ORDER — FUROSEMIDE 10 MG/ML IJ SOLN
40.0000 mg | Freq: Every day | INTRAMUSCULAR | Status: DC
  Administered 2011-12-20 – 2011-12-21 (×2): 40 mg via INTRAVENOUS
  Filled 2011-12-20 (×3): qty 4

## 2011-12-20 MED ORDER — AMIODARONE HCL 200 MG PO TABS
200.0000 mg | ORAL_TABLET | Freq: Two times a day (BID) | ORAL | Status: DC
  Administered 2011-12-20 – 2011-12-22 (×4): 200 mg via ORAL
  Filled 2011-12-20 (×5): qty 1

## 2011-12-20 MED ORDER — INSULIN ASPART 100 UNIT/ML ~~LOC~~ SOLN
0.0000 [IU] | Freq: Three times a day (TID) | SUBCUTANEOUS | Status: DC
  Administered 2011-12-20: 2 [IU] via SUBCUTANEOUS
  Administered 2011-12-20: 1 [IU] via SUBCUTANEOUS

## 2011-12-20 MED ORDER — PANTOPRAZOLE SODIUM 40 MG PO PACK
40.0000 mg | PACK | Freq: Every day | ORAL | Status: DC
  Administered 2011-12-20 – 2011-12-22 (×3): 40 mg via ORAL
  Filled 2011-12-20 (×5): qty 20

## 2011-12-20 MED ORDER — DOXYCYCLINE HYCLATE 100 MG PO TABS
100.0000 mg | ORAL_TABLET | Freq: Two times a day (BID) | ORAL | Status: DC
  Administered 2011-12-20 – 2011-12-22 (×5): 100 mg via ORAL
  Filled 2011-12-20 (×6): qty 1

## 2011-12-20 MED ORDER — ALBUTEROL SULFATE (5 MG/ML) 0.5% IN NEBU
2.5000 mg | INHALATION_SOLUTION | RESPIRATORY_TRACT | Status: DC
  Administered 2011-12-20: 2.5 mg via RESPIRATORY_TRACT
  Filled 2011-12-20: qty 0.5

## 2011-12-20 NOTE — Progress Notes (Signed)
Lynn Bottoms, MD, [facility] ABIM Board Certified in Adult Cardiovascular Medicine,Internal Medicine and Critical Care Medicine    SUBJECTIVE:  Patient doing much better today. No SOB. No SCP. Feels much better.    Past Medical History  Diagnosis Date  . CHF (congestive heart failure)   . COPD (chronic obstructive pulmonary disease)     Current Facility-Administered Medications  Medication Dose Route Frequency Provider Last Rate Last Dose  . albuterol (PROVENTIL) (5 MG/ML) 0.5% nebulizer solution 2.5 mg  2.5 mg Nebulization Q4H PRN Darnelle Maffucci, MD      . ipratropium (ATROVENT) nebulizer solution 0.5 mg  0.5 mg Nebulization Q4H Kalman Shan, MD       And  . albuterol (PROVENTIL) (5 MG/ML) 0.5% nebulizer solution 2.5 mg  2.5 mg Nebulization Q4H Kalman Shan, MD      . amiodarone (PACERONE) tablet 400 mg  400 mg Oral BID June Leap, MD   400 mg at 12/20/11 1017  . antiseptic oral rinse (BIOTENE) solution 15 mL  15 mL Mouth Rinse QID Lupita Leash, MD   15 mL at 12/20/11 0400  . aspirin EC tablet 81 mg  81 mg Oral Daily Alyson Reedy, MD   81 mg at 12/20/11 1017  . budesonide (PULMICORT) nebulizer solution 0.25 mg  0.25 mg Nebulization BID Kalman Shan, MD   0.25 mg at 12/20/11 0845  . diltiazem (CARDIZEM) tablet 60 mg  60 mg Oral Q8H June Leap, MD   60 mg at 12/20/11 0518  . food thickener (RESOURCE THICKENUP CLEAR) powder   Oral PRN Riley Nearing Deblois, CCC-SLP      . heparin injection 5,000 Units  5,000 Units Subcutaneous Q8H Darnelle Maffucci, MD   5,000 Units at 12/20/11 0517  . insulin aspart (novoLOG) injection 0-9 Units  0-9 Units Subcutaneous TID WC Kalman Shan, MD      . mulitivitamin liquid 5 mL  5 mL Per Tube Daily Tonye Becket, RD   5 mL at 12/20/11 1017  . pantoprazole sodium (PROTONIX) 40 mg/20 mL oral suspension 40 mg  40 mg Oral Q1200 Kalman Shan, MD      . piperacillin-tazobactam (ZOSYN) IVPB 3.375 g  3.375 g Intravenous Q8H  Severiano Gilbert, PHARMD   3.375 g at 12/20/11 0518  . predniSONE (DELTASONE) tablet 40 mg  40 mg Oral Q breakfast Kalman Shan, MD      . vancomycin (VANCOCIN) 500 mg in sodium chloride 0.9 % 100 mL IVPB  500 mg Intravenous Q24H Severiano Gilbert, PHARMD   500 mg at 12/19/11 2030  . DISCONTD: 0.9 %  sodium chloride infusion  250 mL Intravenous PRN Darnelle Maffucci, MD 4 mL/hr at 12/17/11 0956 250 mL at 12/17/11 0956  . DISCONTD: 0.9 %  sodium chloride infusion  250 mL Intravenous PRN Lupita Leash, MD   250 mL at 12/19/11 0212  . DISCONTD: 0.9 %  sodium chloride infusion   Intravenous PRN Lupita Leash, MD 5 mL/hr at 12/17/11 2042 5 mL/hr at 12/17/11 2042  . DISCONTD: albuterol (PROVENTIL) (5 MG/ML) 0.5% nebulizer solution 2.5 mg  2.5 mg Nebulization Q6H Kalman Shan, MD   2.5 mg at 12/20/11 0844  . DISCONTD: amiodarone (NEXTERONE PREMIX) 360 mg/200 mL dextrose IV infusion  30 mg/hr Intravenous Continuous Dolores Patty, MD 16.7 mL/hr at 12/19/11 0627 30 mg/hr at 12/19/11 0627  . DISCONTD: dextrose 10 % infusion   Intravenous PRN Brooke Pace  McQuaid, MD      . DISCONTD: diltiazem (CARDIZEM) 100 mg in dextrose 5 % 100 mL infusion  5-15 mg/hr Intravenous Continuous Nyoka Cowden, MD   10 mg/hr at 12/17/11 2100  . DISCONTD: insulin aspart (novoLOG) injection 0-4 Units  0-4 Units Subcutaneous Q4H Darnelle Maffucci, MD   1 Units at 12/20/11 1016  . DISCONTD: ipratropium (ATROVENT) nebulizer solution 0.5 mg  0.5 mg Nebulization Q6H Kalman Shan, MD   0.5 mg at 12/20/11 0844  . DISCONTD: methylPREDNISolone sodium succinate (SOLU-MEDROL) 40 mg/mL injection 40 mg  40 mg Intravenous Q6H Alyson Reedy, MD   40 mg at 12/20/11 1016  . DISCONTD: pantoprazole sodium (PROTONIX) 40 mg/20 mL oral suspension 40 mg  40 mg Per Tube Q1200 Darnelle Maffucci, MD   40 mg at 12/19/11 1235  . DISCONTD: sodium chloride 0.9 % injection 3 mL  3 mL Intravenous Q12H Darnelle Maffucci, MD   3 mL at 12/20/11 1017  .  DISCONTD: sodium chloride 0.9 % injection 3 mL  3 mL Intravenous PRN Darnelle Maffucci, MD        LABS: Basic Metabolic Panel:  Basename 12/19/11 0650 12/18/11 0530  NA 142 136  K 3.9 3.5  CL 95* 91*  CO2 35* 34*  GLUCOSE 137* 161*  BUN 49* 43*  CREATININE 1.63* 1.57*  CALCIUM 8.8 8.7  MG 2.0 2.2  PHOS 3.4 4.6    CBC:  Basename 12/19/11 0650 12/18/11 0530  WBC 14.1* 13.3*  NEUTROABS -- --  HGB 10.0* 10.1*  HCT 30.3* 31.3*  MCV 89.9 89.2  PLT 220 210    Cardiac Enzymes:  Basename 12/17/11 2149 12/17/11 1330  CKTOTAL 109 157  CKMB 4.0 6.8*  CKMBINDEX -- --  TROPONINI <0.30 0.50*    BNP (last 3 results)  Basename 12/20/11 0600 12/17/11 0650  PROBNP 9154.0* 29375.0*    RADIOLOGY: No results found.    PHYSICAL EXAM  Filed Vitals:   12/20/11 0800 12/20/11 0845 12/20/11 0900 12/20/11 1000  BP:    136/52  Pulse: 62  69 60  Temp:      TempSrc:      Resp: 21  23 16   Height:      Weight:      SpO2: 97% 97% 100% 93%   BP 136/52  Pulse 60  Temp(Src) 97.7 F (36.5 C) (Oral)  Resp 16  Ht 5\' 4"  (1.626 m)  Wt 233 lb 11 oz (106 kg)  BMI 40.11 kg/m2  SpO2 93%  Intake/Output Summary (Last 24 hours) at 12/20/11 1035 Last data filed at 12/20/11 0600  Gross per 24 hour  Intake  633.5 ml  Output   1550 ml  Net -916.5 ml   I/O last 3 completed shifts: In: 1228.9 [P.O.:360; I.V.:606.4; IV Piggyback:262.5] Out: 3150 [Urine:3150] General: well- nourished. No distress HEENT: normal carotid upstroke. Normal JVP. No thyromegaly Cardiac: RRR, NL S1/S2. No pathologic murmurs Lungs:decreased BS bilaterally Abdomen, soft, non- tender Extremities. No edema. Normal distal pulses Skin: warm and dry Psychologic: normal affect.   TELEMETRY: Reviewed telemetry pt in NSR - Left ventricle: The cavity size was normal. Wall thickness was normal. Systolic function was normal. The estimated ejection fraction was in the range of 55% to 60%. Wall motion was normal; there  were no regional wall motion abnormalities. Features are consistent with a pseudonormal left ventricular filling pattern, with concomitant abnormal relaxation and increased filling pressure (grade 2 diastolic dysfunction). - Aortic valve: Mild regurgitation. - Mitral valve:  Moderate regurgitation. - Left atrium: The atrium was mildly dilated. - Right atrium: The atrium was mildly dilated. - Atrial septum: No defect or patent foramen ovale was identified. - Tricuspid valve: Moderate regurgitation. - Pulmonary arteries: PA peak pressure: 50mm Hg (S). - Pericardium, extracardiac: There was a left pleural effusion.    ASSESSMENT AND PLAN:  1.  Acute respiratory failure with hypercapnia   2.  Chronic diastolic congestive heart failure   3.  COPD (chronic obstructive pulmonary disease)   4.  HTN (hypertension)   5.  Acute on chronic diastolic congestive heart failure - grade II diastolic dysfunction c/w elevated LVEDP  6.  Atrial fibrillation   7.  Acute renal insufficiency  Creatine 1.63 lasix on hold.  8.  Hypertensive urgency   9.   Pulmonary hypertension PA pressure 50 mmHg   Patient Active Problem List   Diagnoses   .  Acute respiratory failure with hypercapnia   .  COPD (chronic obstructive pulmonary disease)   .  Acute on chronic diastolic congestive heart failure- ECG negative for ischemia   .  HTN (hypertension)   .  Acute renal insufficiency   .  Hypertensive urgency   .  Pulmonary hypertension   .  Atrial fibrillation - paroxysmal but suspect patient had this before  Markedly elevated BNP level associated with significant biatrial enlargement By bedside ECHO today c/w severe diastolic dysfucntion - susopect grade III or IV     PLAN   BNP dramatically improved- patient does not appear volume overloaded and creatinine slightly up  Probably low dose diuretic on D/C - daily weights at home  Will have Dr Myrtis Ser make decision for long term warfarin which I would favor-  high CHADS-Vasc score-currently on SQ heparin.  Patient is on home O 2 already.    Decrease amiodarone to 200mg  Po BID  Tx to telemetry.   Alvin Critchley [facility] 12/20/2011, 10:35 AM

## 2011-12-20 NOTE — Progress Notes (Signed)
PCCM PROGRESS NOTES  HPI:  The patient is a 78 YO woman who comes as a transfer from [facility] hospital. She has recent diagnosis of COPD and CHF (dialstolic, EF 60-65%), who was seen by home health nurse and noted to have increased SOB and increased blood pressure. She was moved to the ED at [facility] and in respiratory distress and put on BIPAP while ABG was obtained. Pt was acidotic and was intubated. She was started on nitro drip for her hypertensive which caused her to become slightly hypotensive. She was maintained on the nitro drip throughout transfer to [facility] and her BP did come up slightly. She had not been taking her HF meds at home the past several days.  She had recently been discharged from [facility] after a new diagnosis of COPD, pulmonary hypertension and a new diagnosis of CHF.   Antibiotics:   Vancomycin 4/25>>>12/20/2011 Zosyn 4/25>>> 12/20/2011 Doyxycyline 12/20/2011 >>    Anti-infectives     Start     Dose/Rate Route Frequency Ordered Stop   12/18/11 1800   vancomycin (VANCOCIN) 500 mg in sodium chloride 0.9 % 100 mL IVPB        500 mg 100 mL/hr over 60 Minutes Intravenous Every 24 hours 12/17/11 1430     12/17/11 1600   vancomycin (VANCOCIN) 750 mg in sodium chloride 0.9 % 150 mL IVPB        750 mg 150 mL/hr over 60 Minutes Intravenous  Once 12/17/11 1430 12/17/11 1626   12/17/11 1500  piperacillin-tazobactam (ZOSYN) IVPB 3.375 g       3.375 g 12.5 mL/hr over 240 Minutes Intravenous 3 times per day 12/17/11 1427             Cultures/Sepsis Markers:   Blood 4/25 >>   Neg as of 12/20/2011 Urine 4/25 >> neg final Sputum 4/25 > neg final   Lab 12/17/11 0650  PROCALCITON 4.49     Results for orders placed during the hospital encounter of 12/17/11  MRSA PCR SCREENING     Status: Normal   Collection Time   12/17/11  6:02 AM      Component Value Range Status Comment   MRSA by PCR NEGATIVE  NEGATIVE  Final   CULTURE, BLOOD (ROUTINE X 2)      Status: Normal (Preliminary result)   Collection Time   12/17/11  6:50 AM      Component Value Range Status Comment   Specimen Description BLOOD RIGHT ARM   Final    Special Requests BOTTLES DRAWN AEROBIC AND ANAEROBIC 10CC   Final    Culture  Setup Time [id number]   Final    Culture     Final    Value:        BLOOD CULTURE RECEIVED NO GROWTH TO DATE CULTURE WILL BE HELD FOR 5 DAYS BEFORE ISSUING A FINAL NEGATIVE REPORT   Report Status PENDING   Incomplete   URINE CULTURE     Status: Normal   Collection Time   12/17/11  6:50 AM      Component Value Range Status Comment   Specimen Description URINE, CATHETERIZED   Final    Special Requests NONE   Final    Culture  Setup Time [id number]   Final    Colony Count NO GROWTH   Final    Culture NO GROWTH   Final    Report Status 12/18/2011 FINAL   Final   CULTURE, BLOOD (ROUTINE X 2)  Status: Normal (Preliminary result)   Collection Time   12/17/11  7:40 AM      Component Value Range Status Comment   Specimen Description BLOOD RIGHT HAND   Final    Special Requests BOTTLES DRAWN AEROBIC ONLY 3CC   Final    Culture  Setup Time [id number]   Final    Culture     Final    Value:        BLOOD CULTURE RECEIVED NO GROWTH TO DATE CULTURE WILL BE HELD FOR 5 DAYS BEFORE ISSUING A FINAL NEGATIVE REPORT   Report Status PENDING   Incomplete   CULTURE, RESPIRATORY     Status: Normal   Collection Time   12/17/11  8:00 AM      Component Value Range Status Comment   Specimen Description TRACHEAL ASPIRATE   Final    Special Requests Normal   Final    Gram Stain     Final    Value: RARE WBC PRESENT,BOTH PMN AND MONONUCLEAR     NO SQUAMOUS EPITHELIAL CELLS SEEN     NO ORGANISMS SEEN   Culture Non-Pathogenic Oropharyngeal-type Flora Isolated.   Final    Report Status 12/19/2011 FINAL   Final      Access/Protocols:  4/25 ETT >>4/26  Best Practice: DVT: SQ hep GI: Protonix  Subjective: dOING WELL Ppost extubation. At bedside all cheerful.  She is sitting an eating  Physical Exam: Filed Vitals:   12/20/11 1000  BP: 136/52  Pulse: 60  Temp:   Resp: 16    Intake/Output Summary (Last 24 hours) at 12/20/11 1037 Last data filed at 12/20/11 0600  Gross per 24 hour  Intake  633.5 ml  Output   1550 ml  Net -916.5 ml      Neuro: Awake and intubated. Sitting in chair. Talking. Eating. Cheerful. RASS +1. CAM-ICU neg for delirium Cardiac: RRR, Nl S1/S2, -M/R/G. Pulmonary: Wheeze + or  Crackles. Barrell chested +.  GI: Soft, NT, ND and +BS. Extremities: NO edema and -tenderness. Psych: Cheerful Gen:  Cachectic  Labs: CBC    Component Value Date/Time   WBC 14.1* 12/19/2011 0650   RBC 3.37* 12/19/2011 0650   HGB 10.0* 12/19/2011 0650   HCT 30.3* 12/19/2011 0650   PLT 220 12/19/2011 0650   MCV 89.9 12/19/2011 0650   MCH 29.7 12/19/2011 0650   MCHC 33.0 12/19/2011 0650   RDW 15.8* 12/19/2011 0650   BMET    Component Value Date/Time   NA 142 12/19/2011 0650   K 3.9 12/19/2011 0650   CL 95* 12/19/2011 0650   CO2 35* 12/19/2011 0650   GLUCOSE 137* 12/19/2011 0650   BUN 49* 12/19/2011 0650   CREATININE 1.63* 12/19/2011 0650   CALCIUM 8.8 12/19/2011 0650   GFRNONAA 30* 12/19/2011 0650   GFRAA 34* 12/19/2011 0650   ABG    Component Value Date/Time   PHART 7.470* 12/19/2011 1115   PCO2ART 50.2* 12/19/2011 1115   PO2ART 86.0 12/19/2011 1115   HCO3 36.5* 12/19/2011 1115   TCO2 38 12/19/2011 1115   O2SAT 97.0 12/19/2011 1115    Lab 12/19/11 0650  MG 2.0   Lab Results  Component Value Date   CALCIUM 8.8 12/19/2011   PHOS 3.4 12/19/2011   Chest Xray:    No results found.   Lab 12/19/11 0650 12/18/11 0530 12/17/11 0650  NA 142 136 139  K 3.9 3.5 --  CL 95* 91* 96  CO2 35* 34* 34*  GLUCOSE 137*  161* 149*  BUN 49* 43* 30*  CREATININE 1.63* 1.57* 1.34*  CALCIUM 8.8 8.7 9.1  MG 2.0 2.2 2.2  PHOS 3.4 4.6 4.9*    Lab 12/20/11 0600 12/17/11 0650  PROBNP 9154.0* 29375.0*      Lab 12/17/11 2149 12/17/11 1330 12/17/11  0650  TROPONINI <0.30 0.50* 0.70*     Lab 12/19/11 1115 12/18/11 0300 12/17/11 0639  PHART 7.470* 7.419* 7.401*  PCO2ART 50.2* 55.2* 56.1*  PO2ART 86.0 117.0* 236.0*  HCO3 36.5* 35.1* 34.8*  TCO2 38 36.8 37  O2SAT 97.0 99.0 100.0     Assessment & Plan: 78 year old female with PMH of ?COPD and CHF presenting with VDRF due to acute diastolic CHF due to Hypertensive urgency and AE- COPD.     Pulmonary: AECOPD and  Acute Diastolic CHF resulting in Acute on Respiratory Failure. Likely chronic hypercapnic resp failure based on inpatient ABG Plan: Diurese again toda; 40mg  IV lasix x 1 on 12/19/11  Monitor K and replace as needed.  Titrate O2 for sat of 88-92%.  Solumedrol at 40 mg IV q6 hoursl; changed to po prednisone 12/20/11  Continue abx.  Do duoneb q6h + pulmicort neb q12h + albuterol prn - will need this at discharge  Will likely  need spirometry prior to dc to classify copd risk (but appears to be high risk copd patient)  Needs opd pulmonary  Cardiac: Acute Diastolic CHF by history, positive troponins. Due to hypertensive urgency. Also A Fib  Lab 12/20/11 0600 12/17/11 0650  PROBNP 9154.0* 29375.0*    Plan: 2D echo pending; done 4/27  Po amio since 4/27 (off IV)  Cardiology input appreciated.  Need cardiology input on IV heparin (currently on sq heparin) - d/w Dr Liliana Cline  GI: No active issues. Plan: Oral feeds.D3 diet  Renal: Borderline renal function with CHF. Acute renal failure - worsening creatinine    Lab 12/19/11 0650 12/18/11 0530 12/17/11 0650  CREATININE 1.63* 1.57* 1.34*    Lab 12/19/11 0650 12/18/11 0530 12/17/11 0650  K 3.9 3.5 4.3      Plan: Lasix daily since 4/27 (saline lock IV)  F/U BMET.  Replace K as needed  Endocrine: ISS  ID: RLL infiltrate./ pneumonia - culture negative  Lab 12/17/11 0650  PROCALCITON 4.49    Plan: DC Vanc/zosyn.-> Change to po doxy x 5 days since 4/28  Check urine strep and leg 4/28   GLOBAL  On 4/26: Dr  Thurmond Butts: Spoke with the family bedside, they are sure patient would not want long term support but are holding on decision making until the other four siblings arrive (there are seven of them) which is reasonable.  For now continue as full code.  On 4/27: Dr Marchelle Gearing updaed family at bedside. No discussion about code status. Tx to SDU. Will remain on PCCM due to high risk COPD status  On 4/28: Move to tele -ok per Dr Andee Lineman. Get PT. Stay on PCCM due to high risk copd      Dr. Kalman Shan, M.D., [facility].C.P Pulmonary and Critical Care Medicine Staff Physician [facility] Pager: [phone], If no answer or between  15:00h - 7:00h: call [phone]  12/20/2011 10:37 AM

## 2011-12-20 NOTE — Progress Notes (Signed)
eLink Physician-Brief Progress Note Patient Name: Makinsey Pepitone DOB: 11-16-1933 MRN: [id number]  Date of Service  12/20/2011   HPI/Events of Note  RN says patient having HR 100-120   eICU Interventions  Change albuterol to xopenex   Intervention Category Intermediate Interventions: Arrhythmia - evaluation and management  Kenijah Benningfield 12/20/2011, 4:39 PM

## 2011-12-20 NOTE — Progress Notes (Signed)
pulmicort missing

## 2011-12-21 ENCOUNTER — Inpatient Hospital Stay (HOSPITAL_COMMUNITY): Payer: Medicare Other

## 2011-12-21 LAB — GLUCOSE, CAPILLARY: Glucose-Capillary: 109 mg/dL — ABNORMAL HIGH (ref 70–99)

## 2011-12-21 MED ORDER — TIOTROPIUM BROMIDE MONOHYDRATE 18 MCG IN CAPS
18.0000 ug | ORAL_CAPSULE | Freq: Every day | RESPIRATORY_TRACT | Status: DC
  Administered 2011-12-21 – 2011-12-22 (×2): 18 ug via RESPIRATORY_TRACT
  Filled 2011-12-21: qty 5

## 2011-12-21 MED ORDER — DILTIAZEM HCL ER COATED BEADS 180 MG PO CP24
180.0000 mg | ORAL_CAPSULE | Freq: Every day | ORAL | Status: DC
  Administered 2011-12-21 – 2011-12-22 (×2): 180 mg via ORAL
  Filled 2011-12-21 (×2): qty 1

## 2011-12-21 MED ORDER — LEVALBUTEROL HCL 0.63 MG/3ML IN NEBU
0.6300 mg | INHALATION_SOLUTION | RESPIRATORY_TRACT | Status: DC | PRN
  Filled 2011-12-21: qty 3

## 2011-12-21 NOTE — Progress Notes (Signed)
Pt converted to Normal Sinus Rhythm an EKG was done at 2001. The MD on call was notified. Will continue to monitor the pt. Sanda Linger

## 2011-12-21 NOTE — Progress Notes (Signed)
Per report pt having blood tinged urine post foley removal--pt denies discomfort with urination--will cont to monitor. Dierdre Highman

## 2011-12-21 NOTE — Progress Notes (Signed)
SUBJECTIVE: The patient is doing well today.  At this time, she denies chest pain, shortness of breath, or any new concerns.       Marland Kitchen amiodarone  200 mg Oral BID  . antiseptic oral rinse  15 mL Mouth Rinse QID  . aspirin EC  81 mg Oral Daily  . budesonide  0.25 mg Nebulization BID  . diltiazem  60 mg Oral Q8H  . doxycycline  100 mg Oral Q12H  . furosemide  40 mg Intravenous Daily  . heparin  5,000 Units Subcutaneous Q8H  . insulin aspart  0-9 Units Subcutaneous TID WC  . ipratropium  0.5 mg Nebulization Q6H  . levalbuterol  0.63 mg Nebulization Q6H  . mulitivitamin  5 mL Per Tube Daily  . pantoprazole sodium  40 mg Oral Q1200  . predniSONE  40 mg Oral Q breakfast  . DISCONTD: albuterol  2.5 mg Nebulization Q6H  . DISCONTD: albuterol  2.5 mg Nebulization Q4H  . DISCONTD: amiodarone  400 mg Oral BID  . DISCONTD: insulin aspart  0-4 Units Subcutaneous Q4H  . DISCONTD: ipratropium  0.5 mg Nebulization Q6H  . DISCONTD: ipratropium  0.5 mg Nebulization Q4H  . DISCONTD: levalbuterol  0.63 mg Nebulization Q4H  . DISCONTD: methylPREDNISolone (SOLU-MEDROL) injection  40 mg Intravenous Q6H  . DISCONTD: pantoprazole sodium  40 mg Per Tube Q1200  . DISCONTD: piperacillin-tazobactam (ZOSYN)  IV  3.375 g Intravenous Q8H  . DISCONTD: sodium chloride  3 mL Intravenous Q12H  . DISCONTD: vancomycin  500 mg Intravenous Q24H      OBJECTIVE: Physical Exam: Filed Vitals:   12/20/11 1203 12/20/11 1315 12/20/11 2100 12/21/11 0600  BP:  161/47 174/60 147/51  Pulse:  71 77 63  Temp: 98.2 F (36.8 C) 98.1 F (36.7 C) 98.5 F (36.9 C) 97.9 F (36.6 C)  TempSrc: Oral Oral    Resp:  20 16 16   Height:      Weight:      SpO2:  97% 94% 97%    Intake/Output Summary (Last 24 hours) at 12/21/11 0801 Last data filed at 12/20/11 1700  Gross per 24 hour  Intake    240 ml  Output      0 ml  Net    240 ml    Telemetry reveals afib yesterday, now sinus rhythm  GEN- The patient is elderly appearing,  alert and oriented x 3 today.   Head- normocephalic, atraumatic Eyes-  Sclera clear, conjunctiva pink Ears- hearing intact Oropharynx- clear Lungs- few bibasilar rales, prolonged expiratory phase, normal work of breathing Heart- Regular rate and rhythm,  GI- soft, NT, ND, + BS Extremities- no clubbing, cyanosis, or edema   LABS: Basic Metabolic Panel:  Basename 12/19/11 0650  NA 142  K 3.9  CL 95*  CO2 35*  GLUCOSE 137*  BUN 49*  CREATININE 1.63*  CALCIUM 8.8  MG 2.0  PHOS 3.4   Liver Function Tests: No results found for this basename: AST:2,ALT:2,ALKPHOS:2,BILITOT:2,PROT:2,ALBUMIN:2 in the last 72 hours No results found for this basename: LIPASE:2,AMYLASE:2 in the last 72 hours CBC:  Basename 12/19/11 0650  WBC 14.1*  NEUTROABS --  HGB 10.0*  HCT 30.3*  MCV 89.9  PLT 220    ASSESSMENT AND PLAN:  Principal Problem:  *Acute respiratory failure with hypercapnia Active Problems:  COPD (chronic obstructive pulmonary disease)  Acute on chronic diastolic congestive heart failure  HTN (hypertension)  Acute renal insufficiency  Hypertensive urgency  Pulmonary hypertension  Atrial fibrillation  ASSESSMENT  AND PLAN:  1. Acute hypercarbic respiratory failure/ COPD- improving with medical therapy Per PCCM  2. Acute on chronic diastolic dysfunction/pulmonary hypertension- improved with diuresis Would likely switch to PO lasix tomorrow  3. Afib- stable Continue amidiodarone I recommended coumadin, however, she is very clear in her decision to decline anticoagulation.  Given her age and renal failure, I would not recommend a novel anticoagulant. Switch cardizem to daily  4. HTN- improving  5. CRI- stable  PT to see  Hillis Range, MD 12/21/2011 8:01 AM

## 2011-12-21 NOTE — Progress Notes (Signed)
Physical Therapy Treatment Patient Details Name: Lynn Evans MRN: [id number] DOB: [birth year] Today's Date: 12/21/2011 Time: 8295-6213 PT Time Calculation (min): 33 min  PT Assessment / Plan / Recommendation Comments on Treatment Session  Pt activity tolerance much improved.  Pt SpO2 on 2 L via [place] in low 90s while ambulating. Dropped to 88 on room air in standing. Provided pt with handout for HEP.       Follow Up Recommendations  Home health PT;Supervision - Intermittent    Equipment Recommendations  None recommended by PT    Frequency Min 3X/week   Plan Discharge plan remains appropriate;Frequency remains appropriate    Precautions / Restrictions Precautions Precautions: Fall Restrictions Weight Bearing Restrictions: No   Pertinent Vitals/Pain Pt denies pain.     Mobility  Bed Mobility Bed Mobility: Supine to Sit;Sit to Supine Supine to Sit: 5: Supervision Sit to Supine: 5: Supervision Transfers Transfers: Sit to Stand;Stand to Sit Sit to Stand: 5: Supervision Stand to Sit: 5: Supervision Details for Transfer Assistance: verbal cues for hand placement to sit.  Ambulation/Gait Ambulation/Gait Assistance: 4: Min guard Ambulation Distance (Feet): 200 Feet Assistive device: Rolling walker Ambulation/Gait Assistance Details: Pt required two standing rest breaks.   Gait Pattern: Step-through pattern;Decreased step length - left;Decreased step length - right;Shuffle;Trunk flexed Gait velocity: Slow Stairs: No Wheelchair Mobility Wheelchair Mobility: No    Exercises Total Joint Exercises Ankle Circles/Pumps: AROM;Both;10 reps Straight Leg Raises: Both;5 reps;Supine Long Arc Quad: 5 reps;Seated;Both   PT Goals Acute Rehab PT Goals PT Goal Formulation: With patient/family Time For Goal Achievement: 12/26/11 Potential to Achieve Goals: Good Pt will go Supine/Side to Sit: with modified independence PT Goal: Supine/Side to Sit - Progress: Met Pt will Sit at Edge of  Bed: with modified independence;1-2 min PT Goal: Sit at Edge Of Bed - Progress: Met Pt will go Sit to Supine/Side: with modified independence PT Goal: Sit to Supine/Side - Progress: Met Pt will go Sit to Stand: with modified independence PT Goal: Sit to Stand - Progress: Goal set today Pt will go Stand to Sit: with modified independence PT Goal: Stand to Sit - Progress: Progressing toward goal Pt will Transfer Bed to Chair/Chair to Bed: with modified independence PT Transfer Goal: Bed to Chair/Chair to Bed - Progress: Progressing toward goal Pt will Ambulate: >150 feet;with modified independence PT Goal: Ambulate - Progress: Progressing toward goal Pt will Go Up / Down Stairs: 3-5 stairs;with min assist;with least restrictive assistive device PT Goal: Up/Down Stairs - Progress: Not met  Visit Information  Last PT Received On: 12/21/11 Assistance Needed: +1    Subjective Data  Subjective: I anit done nothin since I been here but walk to the bathroom.  Patient Stated Goal: To return home with family   Cognition  Overall Cognitive Status: Appears within functional limits for tasks assessed/performed Arousal/Alertness: Awake/alert Orientation Level: Oriented X4 / Intact    Balance  Balance Balance Assessed: No  End of Session PT - End of Session Equipment Utilized During Treatment: Gait belt;Oxygen Activity Tolerance: Patient tolerated treatment well Patient left: in bed;with call bell/phone within reach;with bed alarm set;with nursing in room;with family/visitor present Nurse Communication: Mobility status    Adrean Heitz 12/21/2011, 6:59 PM Elyana Grabski L. Manika Hast DPT [phone]

## 2011-12-21 NOTE — Progress Notes (Signed)
Pt states abdominal sites from Heparin injections continue to ooze "for hours after its given and messes up my gown and bed linen.  Pt refusing HS dose at this time due to oozing and blood tinged urine.  Upon inspection of abdomen--pt has no active oozing but several sites with dried blood/scab present and also dried blood noted to linen and gown.  Discussed reason/need for injections but pt still refusing---wants to discuss with MD. Dierdre Highman

## 2011-12-21 NOTE — Progress Notes (Signed)
Speech Language Pathology Dysphagia Treatment  Patient Details Name: Lynn Evans MRN: [id number] DOB: 08/29/1933 Today's Date: 12/21/2011  SLP Assessment/Plan/Recommendation Assessment / Recommendations / Plan Clinical Impression Statement: Pt without any s/s of aspiration with large consecutive swallows of thin liquids. Appears dysphagia related to brief intubation has resolved though family continues to report pts voice is more coarse than at baseline. Pt is safe to continue a soft diet with thin liquids and may use straws if desired. SLP provided education regarding basic aspiration precautions.  Continue with Current Diet: Dysphagia 3 (mechanical soft);Thin liquid Liquids provided via: Cup;Straw Medication Administration: Whole meds with liquid Supervision: Patient able to self feed Compensations: Slow rate;Small sips/bites Postural Changes and/or Swallow Maneuvers: Seated upright 90 degrees;Out of bed for meals Plan: All goals met;Discharge SLP treatment due to (comment) (goals met) Swallowing Goals  SLP Swallowing Goals Patient will consume recommended diet without observed clinical signs of aspiration with: Modified independent assistance Swallow Study Goal #1 - Progress: Met Patient will utilize recommended strategies during swallow to increase swallowing safety with: Supervision/safety Swallow Study Goal #2 - Progress: Met  General Temperature Spikes Noted: No Respiratory Status: Room air Behavior/Cognition: Alert;Cooperative;Pleasant mood Oral Cavity - Dentition: Adequate natural dentition Patient Positioning: Upright in bed  Dysphagia Treatment Treatment focused on: Skilled observation of diet tolerance;Patient/family/caregiver education Family/Caregiver Educated: daughter Treatment Methods/Modalities: Skilled observation Patient observed directly with PO's: Yes Type of PO's observed: Thin liquids Feeding: Able to feed self Liquids provided via: Cup;Straw   Harlon Ditty, MA CCC-SLP [phone]  Claudine Mouton 12/21/2011, 9:19 AM

## 2011-12-21 NOTE — Consult Note (Signed)
Pt quit smoking completely last month and has remained tobacco free . Congratulated and encouraged pt to remain tobacco free. Discussed relapse prevention strategies. Referred to 1-800 quit now for f/u and support. Discussed oral fixation substitutes, second hand smoke and in home smoking policy. Reviewed and gave pt Written education/contact information.

## 2011-12-21 NOTE — Progress Notes (Addendum)
PCCM PROGRESS NOTES  HPI:  The patient is a 78 YO woman who comes as a transfer from [facility] hospital. She has recent diagnosis of COPD and CHF (dialstolic, EF 60-65%), who was seen by home health nurse and noted to have increased SOB and increased blood pressure. She was transported to the ED at [facility] was intubated for respiratory acidosis. She was started on nitro drip for her hypertensive which caused her to become slightly hypotensive. She was maintained on the nitro drip throughout transfer to [facility] and her BP did come up slightly. She had not been taking her HF meds at home the past several days. She had recently been discharged from [facility] after a new diagnosis of COPD, pulmonary hypertension and a new diagnosis of CHF.   Antibiotics:   Vancomycin 4/25>>>12/20/2011 Zosyn 4/25>>> 12/20/2011 Doyxycyline 4/28 >>    Cultures/Sepsis Markers:   Blood 4/25 >>   Neg  Urine 4/25 >> neg final Sputum 4/25 > neg final       Access/Protocols:  4/25 ETT >>4/26  Best Practice: DVT: SQ hep GI: Protonix  Subjective: No new complaints. Feels ready to go home  Physical Exam: Filed Vitals:   12/21/11 0926  BP: 157/66  Pulse:   Temp:   Resp:     Intake/Output Summary (Last 24 hours) at 12/21/11 1236 Last data filed at 12/20/11 1700  Gross per 24 hour  Intake    240 ml  Output      0 ml  Net    240 ml     EXAM: NAD Minimal scattered wheezes and bibasilar crackles RRR s M NABS No edema  DATA:  No new labs or CXR    Assessment & Plan: Acute on chronic respiratory failure due to AECOPD and diastolic CHF  - Change scheduled nebs to Spiriva and PRN Xopenex - Recheck CXR to assess CHF/pulm edema - Assess home O2 needs - D/C Prednisone - Cont abx for now   Acute on chronic renal insufficiency - Recheck chemistries in AM   Atrial fibrillation - Cards managing. NSR presently    Anticipate D/C home 4/30 or 5/1   Billy Fischer, MD;  PCCM  service; Mobile [phone]

## 2011-12-22 ENCOUNTER — Telehealth: Payer: Self-pay | Admitting: Internal Medicine

## 2011-12-22 LAB — GLUCOSE, CAPILLARY: Glucose-Capillary: 88 mg/dL (ref 70–99)

## 2011-12-22 LAB — BASIC METABOLIC PANEL
BUN: 58 mg/dL — ABNORMAL HIGH (ref 6–23)
CO2: 34 mEq/L — ABNORMAL HIGH (ref 19–32)
Chloride: 90 mEq/L — ABNORMAL LOW (ref 96–112)
Creatinine, Ser: 1.49 mg/dL — ABNORMAL HIGH (ref 0.50–1.10)
Glucose, Bld: 90 mg/dL (ref 70–99)

## 2011-12-22 LAB — CBC
HCT: 30.7 % — ABNORMAL LOW (ref 36.0–46.0)
MCH: 29.1 pg (ref 26.0–34.0)
MCHC: 33.2 g/dL (ref 30.0–36.0)
MCV: 87.5 fL (ref 78.0–100.0)
RDW: 15.4 % (ref 11.5–15.5)

## 2011-12-22 MED ORDER — DILTIAZEM HCL ER COATED BEADS 180 MG PO CP24: 180.0000 mg | ORAL_CAPSULE | Freq: Every day | ORAL | Status: DC

## 2011-12-22 MED ORDER — POTASSIUM CHLORIDE CRYS ER 20 MEQ PO TBCR: 20.0000 meq | EXTENDED_RELEASE_TABLET | Freq: Once | ORAL | Status: DC

## 2011-12-22 MED ORDER — AMIODARONE HCL 200 MG PO TABS: ORAL_TABLET | ORAL | Status: DC

## 2011-12-22 MED ORDER — POTASSIUM CHLORIDE CRYS ER 20 MEQ PO TBCR
20.0000 meq | EXTENDED_RELEASE_TABLET | Freq: Once | ORAL | Status: AC
  Administered 2011-12-22: 20 meq via ORAL
  Filled 2011-12-22: qty 1

## 2011-12-22 MED ORDER — FUROSEMIDE 40 MG PO TABS
40.0000 mg | ORAL_TABLET | Freq: Every day | ORAL | Status: DC
  Administered 2011-12-22: 40 mg via ORAL
  Filled 2011-12-22: qty 1

## 2011-12-22 MED ORDER — FUROSEMIDE 40 MG PO TABS: 40.0000 mg | ORAL_TABLET | Freq: Every day | ORAL | Status: DC

## 2011-12-22 MED ORDER — ASPIRIN 81 MG PO TBEC: 81.0000 mg | DELAYED_RELEASE_TABLET | Freq: Every day | ORAL | Status: DC

## 2011-12-22 MED ORDER — DOXYCYCLINE HYCLATE 100 MG PO TABS: 100.0000 mg | ORAL_TABLET | Freq: Two times a day (BID) | ORAL | Status: DC

## 2011-12-22 MED ORDER — POTASSIUM CHLORIDE CRYS ER 20 MEQ PO TBCR: 20.0000 meq | EXTENDED_RELEASE_TABLET | Freq: Every day | ORAL | Status: DC

## 2011-12-22 MED ORDER — LEVALBUTEROL TARTRATE 45 MCG/ACT IN AERO: 1.0000 | INHALATION_SPRAY | RESPIRATORY_TRACT | Status: DC | PRN

## 2011-12-22 NOTE — Discharge Summary (Signed)
Physician Discharge Summary  Patient ID: Lynn Evans MRN: [id number] DOB/AGE: 1934-01-19 78 y.o.  Admit date: 12/17/2011 Discharge date: 12/22/2011    Discharge Diagnoses:   1. Acute respiratory failure with hypercapnia 2. COPD (chronic obstructive pulmonary disease) -O2 Dependent 3. Acute on chronic diastolic congestive heart failure 4. HTN (hypertension) 5. Acute renal insufficiency 6. Hypertensive urgency 7. Pulmonary hypertension 8. Atrial fibrillation    Brief Summary: Lynn Evans is a 78 y.o. y/o female with a PMH of CHF and COPD on 2L O2 at baseline who was admitted to [facility] on 4/25 as a transfer from [facility]. She has recent diagnosis of COPD and CHF (dialstolic, EF 60-65%), who was seen by home health nurse and noted to have increased SOB and increased blood pressure. She was moved to the ED at [facility] and in respiratory distress and placed on BIPAP while ABG was obtained. Pt was acidotic and was intubated. She was started on nitro drip for her hypertension which caused her to become slightly hypotensive. She was maintained on the nitro drip throughout transfer to [facility] and her BP did come up slightly. She had not been taking her HF meds at home the past several days PTA. She had recently been discharged from [facility] after a new diagnosis of COPD, pulmonary hypertension and a new diagnosis of CHF.  CT of the Head was negative.  She was maintained on mechanical ventilation, bronchodilators, empiric anti-biotics and diuretics.  Nitroglycerin gtt was stopped.  She had mild elevation in troponin-thought likely secondary to CHF and elevated BNP which improved with diuresis.   Hospital course complicated by Atrial fibrillation with RVR.  [facility] was consulted and recommended amiodarone and cardizem for rate / rhythm control.  She was recommended coumadin but the patient declined anti-coagulation. Secondary to age and renal function, no other  anti-coagulant regimen was recommended.  Cardiology recommended Lasix 40mg  daily.  She will have follow up with Dr. Myrtis Ser in [place] with BMP on 5/8 to review renal function / electrolytes.  She was extubated on 4/26 to [place].  She requires 2L O2 at baseline.  Pulmonary regimen was adjusted to Spiriva, Symbicort and PRN Xopenex.  She will complete 7 days total antibiotics.  She will continue with home health RN and PT evaluation.    Antibiotics:  Vancomycin 4/25>>>12/20/2011  Zosyn 4/25>>> 12/20/2011  Doyxycyline 4/28 >>5/1  Cultures/Sepsis Markers:  Blood 4/25 >> Neg  Urine 4/25 >> neg final  Sputum 4/25 > neg final   Access/Protocols:  4/25 ETT >>4/26   Best Practice:  DVT: SQ hep  GI: Protonix   Discharge Exam: General: chronically ill in NAD Neuro: AAOx4, speech clear CV: s1s2 regular PULM: resp's even/non-labored, lungs bilaterally diminished but clear GI: round / soft, bsx4 active, tolerating PO Extremities: warm/dry, no edema    Discharge Labs  BMET  Lab 12/22/11 0715 12/19/11 0650 12/18/11 0530 12/17/11 0650  NA 134* 142 136 139  K 3.6 3.9 -- --  CL 90* 95* 91* 96  CO2 34* 35* 34* 34*  GLUCOSE 90 137* 161* 149*  BUN 58* 49* 43* 30*  CREATININE 1.49* 1.63* 1.57* 1.34*  CALCIUM 8.8 8.8 8.7 9.1  MG -- 2.0 2.2 2.2  PHOS -- 3.4 4.6 4.9*     CBC   Lab 12/22/11 0715 12/19/11 0650 12/18/11 0530  HGB 10.2* 10.0* 10.1*  HCT 30.7* 30.3* 31.3*  WBC 11.6* 14.1* 13.3*  PLT 236 220 210     Discharge Orders  Future Appointments: Provider: Department: Dept Phone: Center:   12/30/2011 2:00 PM Rande Brunt, PA [facility] [phone] [facility]   02/09/2012 11:45 AM Nyoka Cowden, MD [facility]  None     Future Orders Please Complete By Expires   Diet - low sodium heart healthy      Increase activity slowly      Call MD for:  temperature >100.4      Call MD for:  difficulty breathing, headache or visual disturbances      Call MD for:  persistant  dizziness or light-headedness        Medication List  As of 12/22/2011  2:45 PM   START taking these medications         amiodarone 200 MG tablet   Commonly known as: PACERONE   200mg  BID for 2 weeks then 200mg  PO QD      aspirin 81 MG EC tablet   Take 1 tablet (81 mg total) by mouth daily.      diltiazem 180 MG 24 hr capsule   Commonly known as: CARDIZEM CD   Take 1 capsule (180 mg total) by mouth daily.      doxycycline 100 MG tablet   Commonly known as: VIBRA-TABS   Take 1 tablet (100 mg total) by mouth every 12 (twelve) hours.      furosemide 40 MG tablet   Commonly known as: LASIX   Take 1 tablet (40 mg total) by mouth daily.      levalbuterol 45 MCG/ACT inhaler   Commonly known as: XOPENEX HFA   Inhale 1-2 puffs into the lungs every 4 (four) hours as needed for wheezing or shortness of breath.      potassium chloride SA 20 MEQ tablet   Commonly known as: K-DUR,KLOR-CON   Take 1 tablet (20 mEq total) by mouth once.         CONTINUE taking these medications         budesonide-formoterol 160-4.5 MCG/ACT inhaler   Commonly known as: SYMBICORT      tiotropium 18 MCG inhalation capsule   Commonly known as: SPIRIVA      TUMS PO         STOP taking these medications         albuterol 108 (90 BASE) MCG/ACT inhaler          Where to get your medications    These are the prescriptions that you need to pick up.   You may get these medications from any pharmacy.         amiodarone 200 MG tablet   aspirin 81 MG EC tablet   diltiazem 180 MG 24 hr capsule   doxycycline 100 MG tablet   furosemide 40 MG tablet   levalbuterol 45 MCG/ACT inhaler   potassium chloride SA 20 MEQ tablet           Follow-up Information    Follow up with Sandrea Hughs, MD on 02/09/2012. (Appt at 11:45)    Contact information:   [facility] [address] [place]  [phone]       Follow up with Willa Rough, MD on 12/30/2011. (Appt at [facility])    Contact information:     [address] [place] [address] [phone]       Follow up with Lab Draw on 12/30/2011. (Go to hospital with prescritiption for lab draw before seeing Dr. Lily Kocher going to hospital at [facility]  fro blood draw. )          Disposition:  Discharge to Home.  Home O2 at 2L continuous (as previously ordered).  Home Health RN, Aide, Social Work and PT evaluation for safety.     Discharged Condition: Lynn Evans has met maximum benefit of inpatient care and is medically stable and cleared for discharge.  Patient is pending follow up as above.      Time spent on disposition:  Greater than 35 minutes.   Signed: Canary Brim, NP-C  Pulmonary & Critical Care Pgr: [phone]       Pt examined and database reviewed. I agree with above findings, assessment and plan as reflected in the note above.   Billy Fischer, MD;  PCCM service; Mobile [phone]

## 2011-12-22 NOTE — Progress Notes (Signed)
PCCM PROGRESS NOTES  HPI:  The patient is a 78 YO woman who comes as a transfer from [facility] hospital. She has recent diagnosis of COPD and CHF (dialstolic, EF 60-65%), who was seen by home health nurse and noted to have increased SOB and increased blood pressure. She was transported to the ED at [facility] was intubated for respiratory acidosis. She was started on nitro drip for her hypertensive which caused her to become slightly hypotensive. She was maintained on the nitro drip throughout transfer to [facility] and her BP did come up slightly. She had not been taking her HF meds at home the past several days. She had recently been discharged from [facility] after a new diagnosis of COPD, pulmonary hypertension and a new diagnosis of CHF.   Antibiotics:   Vancomycin 4/25>>>12/20/2011 Zosyn 4/25>>> 12/20/2011 Doyxycyline 4/28 >>    Cultures/Sepsis Markers:   Blood 4/25 >>   Neg  Urine 4/25 >> neg final Sputum 4/25 > neg final    Access/Protocols:  4/25 ETT >>4/26  Best Practice: DVT: SQ hep GI: Protonix  Subjective: No new complaints. Feels ready to go home  Physical Exam: Filed Vitals:   12/22/11 1009  BP: 150/75  Pulse:   Temp:   Resp:     Intake/Output Summary (Last 24 hours) at 12/22/11 1300 Last data filed at 12/22/11 8119  Gross per 24 hour  Intake    840 ml  Output      0 ml  Net    840 ml   Vent Mode:  [-]  FiO2 (%):  [28 %] 28 % EXAM: NAD Minimal scattered wheezes and bibasilar crackles RRR s M NABS No edema  DATA:  No new labs or CXR    Assessment & Plan: Acute on chronic respiratory failure due to AECOPD and diastolic CHF  - Change scheduled nebs to Spiriva and PRN Xopenex - Recheck CXR to assess CHF/pulm edema - Assess home O2 needs - D/C Prednisone - Cont abx for now   Acute on chronic renal insufficiency - Recheck chemistries in AM   Atrial fibrillation - Cards managing. NSR presently    DC home today   Billy Fischer, MD;  PCCM service; Mobile [phone]

## 2011-12-22 NOTE — Progress Notes (Signed)
Pt HR sustaining 55-60 (NSR). Brooke, PA notified. Ok to give cardizem 180 and amio 200 PO. Will continue to monitor patient closely. Ramond Craver, RN

## 2011-12-22 NOTE — Plan of Care (Signed)
Problem: Phase II Progression Outcomes Goal: O2 sats > equal to 90% on RA or at baseline Outcome: Completed/Met Date Met:  12/22/11 Pt wears 2L o2 at home. Pt is wearing 2L o2 now with sats>92%

## 2011-12-22 NOTE — Progress Notes (Signed)
   Patient: Lynn Evans Date of Encounter: 12/22/2011, 8:48 AM Admit date: 12/17/2011     Subjective  Lynn Evans denies chest pain, shortness of breath, palpitations or dizziness.  Doing well without any obvious bleeding.   Objective   Physical Exam: Filed Vitals:   12/22/11 0500  BP: 147/64  Pulse: 61  Temp: 98 F (36.7 C)  Resp: 16   General: Well developed, elderly appearing 78 year old female in no acute distress. Head: Normocephalic, atraumatic, sclera non-icteric, no xanthomas, nares are without discharge.  Neck: Supple. JVD not elevated. Lungs: Diminished breath sounds but clear bilaterally to auscultation without wheezes, rales, or rhonchi. Breathing is unlabored. Heart: RRR S1 S2 without murmurs, rubs, or gallops.  Abdomen: Soft, non-distended. Extremities: No clubbing or cyanosis. No edema.  Distal pedal pulses are 2+ and equal bilaterally. Neuro: Alert and oriented X 3. Moves all extremities spontaneously. Psych:  Responds to questions appropriately with a normal affect.    Intake/Output Summary (Last 24 hours) at 12/22/11 0848 Last data filed at 12/21/11 2302  Gross per 24 hour  Intake    840 ml  Output      0 ml  Net    840 ml    Inpatient Medications:    . amiodarone  200 mg Oral BID  . antiseptic oral rinse  15 mL Mouth Rinse QID  . aspirin EC  81 mg Oral Daily  . diltiazem  180 mg Oral Daily  . doxycycline  100 mg Oral Q12H  . furosemide  40 mg Intravenous Daily  . heparin  5,000 Units Subcutaneous Q8H  . pantoprazole sodium  40 mg Oral Q1200  . tiotropium  18 mcg Inhalation Daily    Labs:  Lynn Evans 12/22/11 0715  NA 134*  K 3.6  CL 90*  CO2 34*  GLUCOSE 90  BUN 58*  CREATININE 1.49*  CALCIUM 8.8  MG --  PHOS --   Basename 12/22/11 0715  WBC 11.6*  NEUTROABS --  HGB 10.2*  HCT 30.7*  MCV 87.5  PLT 236    Telemetry: Normal sinus rhythm currently; occasional PACs    Assessment and Plan  1. Acute hypercarbic respiratory  failure/ COPD- improving with medical therapy; per primary medical team/PCCM  2. Acute on chronic diastolic dysfunction/pulmonary hypertension- improved with diuresis; will switch to PO lasix today 3. Atrial fibrillation- stable; continue amidiodarone for rhythm control and diltiazem for rate control; recommended coumadin, however, she is very clear in her decision to decline anticoagulation. Given her age and renal failure, I would not recommend any other anticoagulant, i.e. Pradaxa or Xarelto.  4. HTN- improving; however, if systolic BP consistently >140, will need to add antihypertensive medication (BP on admission 123/69) 5. CRI- stable   Signed, EDMISTEN, BROOKE O. PA-C   I have seen, examined the patient, and reviewed the above assessment and plan.  PT with conversion to sinus rhythm.  Continue amiodarone 200mg  BID x 2 weeks then 200mg  daily.  She declines anticoagulation. Switch lasix to PO today.  I anticipate possible DC to home tomorrow but will defer to PCCM (primary team).   Co Sign: Hillis Range, MD 12/22/2011 12:08 PM

## 2011-12-22 NOTE — Telephone Encounter (Signed)
Close  

## 2011-12-22 NOTE — Progress Notes (Signed)
Pt was given d/c instructions and education from Canary Brim, NP. Education reinforced by me and patient was able to teachback what she learned. Pt was provided with heart failure instructions and education. Pt verbalized understanding. IV removed and heart monitor d/c. Pt verbalizes understanding. Pt is awaiting ride for discharge. Will continue to monitor until patient leaves floor. Ramond Craver, RN

## 2011-12-24 LAB — CULTURE, BLOOD (ROUTINE X 2): Culture  Setup Time: 201304260128

## 2011-12-29 ENCOUNTER — Inpatient Hospital Stay: Payer: Medicare Other | Admitting: Adult Health

## 2011-12-30 ENCOUNTER — Encounter: Payer: Self-pay | Admitting: Physician Assistant

## 2011-12-30 ENCOUNTER — Ambulatory Visit (INDEPENDENT_AMBULATORY_CARE_PROVIDER_SITE_OTHER): Payer: Medicare Other | Admitting: Physician Assistant

## 2011-12-30 ENCOUNTER — Telehealth: Payer: Self-pay | Admitting: *Deleted

## 2011-12-30 VITALS — BP 154/70 | HR 71 | Ht 61.0 in | Wt 99.4 lb

## 2011-12-30 DIAGNOSIS — J4489 Other specified chronic obstructive pulmonary disease: Secondary | ICD-10-CM

## 2011-12-30 DIAGNOSIS — J449 Chronic obstructive pulmonary disease, unspecified: Secondary | ICD-10-CM

## 2011-12-30 DIAGNOSIS — I5030 Unspecified diastolic (congestive) heart failure: Secondary | ICD-10-CM

## 2011-12-30 DIAGNOSIS — I509 Heart failure, unspecified: Secondary | ICD-10-CM

## 2011-12-30 DIAGNOSIS — N289 Disorder of kidney and ureter, unspecified: Secondary | ICD-10-CM

## 2011-12-30 DIAGNOSIS — I5033 Acute on chronic diastolic (congestive) heart failure: Secondary | ICD-10-CM

## 2011-12-30 DIAGNOSIS — I4891 Unspecified atrial fibrillation: Secondary | ICD-10-CM

## 2011-12-30 DIAGNOSIS — I272 Pulmonary hypertension, unspecified: Secondary | ICD-10-CM

## 2011-12-30 DIAGNOSIS — R0989 Other specified symptoms and signs involving the circulatory and respiratory systems: Secondary | ICD-10-CM | POA: Insufficient documentation

## 2011-12-30 DIAGNOSIS — I10 Essential (primary) hypertension: Secondary | ICD-10-CM

## 2011-12-30 MED ORDER — ASPIRIN EC 325 MG PO TBEC: 325.0000 mg | DELAYED_RELEASE_TABLET | Freq: Every day | ORAL | Status: AC

## 2011-12-30 NOTE — Patient Instructions (Signed)
Follow up in 2 months. Increase Aspirin to 325 mg daily. Continue Amiodarone 200 mg two times a day for 1 more week and then decrease to 200 mg daily as previously planned. Your physician has requested that you have a carotid duplex. This test is an ultrasound of the carotid arteries in your neck. It looks at blood flow through these arteries that supply the brain with blood. Allow one hour for this exam. There are no restrictions or special instructions.

## 2011-12-30 NOTE — Assessment & Plan Note (Signed)
Stable, following recent presentation with HTN urgency (236/123). Recommend continued close monitoring, including in the ambulatory setting (patient reports 130-150 systolic range, at home). If persistently elevated, recommend addition of ACE inhibitor with close monitoring of renal function.

## 2011-12-30 NOTE — Assessment & Plan Note (Signed)
Clinically euvolemic by history and current physical exam. Will continue current diuretic regimen with Lasix 40 daily. Recent labs stable with mild renal insufficiency (GFR 38) Continue treating with calcium channel blocker.

## 2011-12-30 NOTE — Assessment & Plan Note (Signed)
Patient on continuous supplemental O2. Follow up with Dr. Sandrea Hughs, as scheduled.

## 2011-12-30 NOTE — Assessment & Plan Note (Signed)
Maintaining NSR on amiodarone. Patient recently declined recommendation for anticoagulation. Therefore, will increase ASA to full dose. Continue diltiazem for additional rate/rhythm control. We'll need to monitor patient closely on amiodarone, given underlying severe PHTN. Patient scheduled to continue current dosing of amiodarone 200 twice a day for one more week, then down titrate to 200 mg daily. We'll have patient return to clinic in 2 months for early followup, and to establish with Dr. Diona Browner, following his recent initial consultation here at [facility].

## 2011-12-30 NOTE — Assessment & Plan Note (Signed)
Stable by current followup labs.

## 2011-12-30 NOTE — Progress Notes (Signed)
HPI: Patient presents in post hospital followup from [facility], following initial transfer from [facility] with acute respiratory failure and acute/chronic DHF. She had just been seen in consultation on April 12, by Dr. Diona Browner here at [facility], presenting with no known history of heart disease. A 2-D echo indicated EF 60-65% with mild/moderate AR, MR, and TR, and severe PHTN (RVSP 60 mmHg). She was discharged, but returned 2 days later in respiratory distress, requiring treatment with BiPAP and subsequent intubation, secondary to respiratory acidosis. She was transferred to [facility] and followed by our team in consultation. She developed PAF RVR and was placed on amiodarone with recommendation to treat at 200 twice a day (x2 weeks), then 200 mg daily, per Dr. Jenel Lucks recommendation at time of discharge. Of note, she declined anticoagulation, and was discharged on low-dose ASA. Followup labs were ordered for this morning, notable for potassium 4.2 BUN 23 and creatinine 1.4.  Clinically, she suggests marked improvement. She is on continuous O2, and denies PND or orthopnea. She does have some mild, recurrent pedal edema, but states that this is not at all like what she had recently.  Patient is scheduled to follow with Dr. Sandrea Hughs on June 18, in the [place] clinic.  Patient was noted to have converted to NSR by time of discharge, per Dr. Johney Frame. 12-lead EKG today suggests NSR, with question of short PR interval.  No Known Allergies  Current Outpatient Prescriptions  Medication Sig Dispense Refill  . amiodarone (PACERONE) 200 MG tablet 200mg  BID for 2 weeks then 200mg  PO QD  60 tablet  3  . aspirin EC 81 MG EC tablet Take 1 tablet (81 mg total) by mouth daily.      . budesonide-formoterol (SYMBICORT) 160-4.5 MCG/ACT inhaler Inhale 2 puffs into the lungs 2 (two) times daily.      . Calcium Carbonate Antacid (TUMS PO) Take 1 tablet by mouth as needed. For heartburn/upset stomach      . diltiazem  (CARDIZEM CD) 180 MG 24 hr capsule Take 1 capsule (180 mg total) by mouth daily.  30 capsule  3  . furosemide (LASIX) 40 MG tablet Take 1 tablet (40 mg total) by mouth daily.  30 tablet  3  . levalbuterol (XOPENEX HFA) 45 MCG/ACT inhaler Inhale 1-2 puffs into the lungs every 4 (four) hours as needed for wheezing or shortness of breath.  1 Inhaler  3  . potassium chloride SA (K-DUR,KLOR-CON) 20 MEQ tablet Take 1 tablet (20 mEq total) by mouth daily.  30 tablet  3  . tiotropium (SPIRIVA) 18 MCG inhalation capsule Place 18 mcg into inhaler and inhale daily.        Past Medical History  Diagnosis Date  . CHF (congestive heart failure)   . COPD (chronic obstructive pulmonary disease)     No past surgical history on file.  History   Social History  . Marital Status: Widowed    Spouse Name: N/A    Number of Children: N/A  . Years of Education: N/A   Occupational History  . Not on file.   Social History Main Topics  . Smoking status: Former Smoker -- 1.0 packs/day for 57 years    Types: Cigarettes    Quit date: 11/16/2011  . Smokeless tobacco: Former Neurosurgeon  . Alcohol Use: No  . Drug Use: No  . Sexually Active: Not on file   Other Topics Concern  . Not on file   Social History Narrative  . No narrative on file  Family History  Problem Relation Age of Onset  . Colon cancer Mother   . Cirrhosis Father     ROS: no nausea, vomiting; no fever, chills; no melena, hematochezia; no claudication  PHYSICAL EXAM: BP 154/70  Pulse 71  Ht 5\' 1"  (1.549 m)  Wt 99 lb 6.4 oz (45.088 kg)  BMI 18.78 kg/m2  SpO2 100% GENERAL: 78 year old female, frail; NAD HEENT: NCAT, PERRLA, EOMI; sclera clear; no xanthelasma NECK:  bilateral carotid bruits; no JVD; no TM LUNGS:  diminished breath sounds, with faint wheezes  CARDIAC: RRR (S1, S2); no significant murmurs; no rubs or gallops ABDOMEN: soft, non-tender; intact BS EXTREMETIES:  1+ bilateral pedal edema SKIN: warm/dry; no obvious  rash/lesions MUSCULOSKELETAL: no joint deformity NEURO: no focal deficit; NL affect   EKG: reviewed and available in Electronic Records   ASSESSMENT & PLAN: Which lying staph

## 2011-12-30 NOTE — Telephone Encounter (Signed)
Patient sent to lab for repeat BMET from hospital d/c and lab order needed diagnosis and MD signature. New order faxed to lab [phone].

## 2011-12-30 NOTE — Assessment & Plan Note (Signed)
We'll order carotid Dopplers to rule out significant ICA stenosis.

## 2012-01-01 ENCOUNTER — Encounter: Payer: Self-pay | Admitting: *Deleted

## 2012-01-06 ENCOUNTER — Encounter (INDEPENDENT_AMBULATORY_CARE_PROVIDER_SITE_OTHER): Payer: Medicare Other

## 2012-01-06 DIAGNOSIS — R0989 Other specified symptoms and signs involving the circulatory and respiratory systems: Secondary | ICD-10-CM

## 2012-01-06 DIAGNOSIS — I6529 Occlusion and stenosis of unspecified carotid artery: Secondary | ICD-10-CM

## 2012-01-11 ENCOUNTER — Encounter: Payer: Medicare Other | Admitting: Internal Medicine

## 2012-02-05 ENCOUNTER — Telehealth: Payer: Self-pay | Admitting: *Deleted

## 2012-02-05 NOTE — Telephone Encounter (Signed)
Patient informed. 

## 2012-02-05 NOTE — Telephone Encounter (Signed)
Message copied by Eustace Moore on Fri Feb 05, 2012 10:14 AM ------      Message from: Rande Brunt      Created: Fri Jan 08, 2012  3:41 PM       Mild ICA disease, f/u 1 year

## 2012-02-09 ENCOUNTER — Encounter: Payer: Medicare Other | Admitting: Internal Medicine

## 2012-02-09 NOTE — Progress Notes (Signed)
 This encounter was created in error - please disregard.

## 2012-02-13 ENCOUNTER — Emergency Department (HOSPITAL_COMMUNITY): Payer: Medicare Other

## 2012-02-13 ENCOUNTER — Encounter (HOSPITAL_COMMUNITY): Payer: Self-pay | Admitting: *Deleted

## 2012-02-13 ENCOUNTER — Inpatient Hospital Stay (HOSPITAL_COMMUNITY)
Admission: EM | Admit: 2012-02-13 | Discharge: 2012-02-22 | DRG: 469 | Disposition: A | Discharge status: Skilled Nursing Facility | Payer: Medicare Other | Attending: Internal Medicine | Admitting: Internal Medicine

## 2012-02-13 DIAGNOSIS — I5033 Acute on chronic diastolic (congestive) heart failure: Secondary | ICD-10-CM

## 2012-02-13 DIAGNOSIS — J449 Chronic obstructive pulmonary disease, unspecified: Secondary | ICD-10-CM

## 2012-02-13 DIAGNOSIS — I4891 Unspecified atrial fibrillation: Secondary | ICD-10-CM

## 2012-02-13 DIAGNOSIS — Z66 Do not resuscitate: Secondary | ICD-10-CM | POA: Diagnosis present

## 2012-02-13 DIAGNOSIS — I129 Hypertensive chronic kidney disease with stage 1 through stage 4 chronic kidney disease, or unspecified chronic kidney disease: Secondary | ICD-10-CM | POA: Diagnosis present

## 2012-02-13 DIAGNOSIS — I10 Essential (primary) hypertension: Secondary | ICD-10-CM

## 2012-02-13 DIAGNOSIS — R0902 Hypoxemia: Secondary | ICD-10-CM | POA: Diagnosis not present

## 2012-02-13 DIAGNOSIS — S72009A Fracture of unspecified part of neck of unspecified femur, initial encounter for closed fracture: Secondary | ICD-10-CM

## 2012-02-13 DIAGNOSIS — J849 Interstitial pulmonary disease, unspecified: Secondary | ICD-10-CM | POA: Diagnosis present

## 2012-02-13 DIAGNOSIS — I272 Pulmonary hypertension, unspecified: Secondary | ICD-10-CM

## 2012-02-13 DIAGNOSIS — N039 Chronic nephritic syndrome with unspecified morphologic changes: Secondary | ICD-10-CM | POA: Diagnosis present

## 2012-02-13 DIAGNOSIS — E871 Hypo-osmolality and hyponatremia: Secondary | ICD-10-CM

## 2012-02-13 DIAGNOSIS — D631 Anemia in chronic kidney disease: Secondary | ICD-10-CM | POA: Diagnosis present

## 2012-02-13 DIAGNOSIS — S72002A Fracture of unspecified part of neck of left femur, initial encounter for closed fracture: Secondary | ICD-10-CM

## 2012-02-13 DIAGNOSIS — D62 Acute posthemorrhagic anemia: Secondary | ICD-10-CM | POA: Diagnosis not present

## 2012-02-13 DIAGNOSIS — I509 Heart failure, unspecified: Secondary | ICD-10-CM | POA: Diagnosis present

## 2012-02-13 DIAGNOSIS — K560 Paralytic ileus: Secondary | ICD-10-CM | POA: Diagnosis present

## 2012-02-13 DIAGNOSIS — R000 Tachycardia, unspecified: Secondary | ICD-10-CM

## 2012-02-13 DIAGNOSIS — I5032 Chronic diastolic (congestive) heart failure: Secondary | ICD-10-CM

## 2012-02-13 DIAGNOSIS — E876 Hypokalemia: Secondary | ICD-10-CM | POA: Diagnosis not present

## 2012-02-13 DIAGNOSIS — N183 Chronic kidney disease, stage 3 unspecified: Secondary | ICD-10-CM

## 2012-02-13 DIAGNOSIS — S72033A Displaced midcervical fracture of unspecified femur, initial encounter for closed fracture: Principal | ICD-10-CM | POA: Diagnosis present

## 2012-02-13 DIAGNOSIS — R0989 Other specified symptoms and signs involving the circulatory and respiratory systems: Secondary | ICD-10-CM

## 2012-02-13 DIAGNOSIS — Y92009 Unspecified place in unspecified non-institutional (private) residence as the place of occurrence of the external cause: Secondary | ICD-10-CM

## 2012-02-13 DIAGNOSIS — J8410 Pulmonary fibrosis, unspecified: Secondary | ICD-10-CM | POA: Diagnosis present

## 2012-02-13 DIAGNOSIS — R339 Retention of urine, unspecified: Secondary | ICD-10-CM | POA: Diagnosis not present

## 2012-02-13 DIAGNOSIS — J9600 Acute respiratory failure, unspecified whether with hypoxia or hypercapnia: Secondary | ICD-10-CM | POA: Diagnosis not present

## 2012-02-13 DIAGNOSIS — W010XXA Fall on same level from slipping, tripping and stumbling without subsequent striking against object, initial encounter: Secondary | ICD-10-CM | POA: Diagnosis present

## 2012-02-13 DIAGNOSIS — J4489 Other specified chronic obstructive pulmonary disease: Secondary | ICD-10-CM | POA: Diagnosis present

## 2012-02-13 DIAGNOSIS — J9601 Acute respiratory failure with hypoxia: Secondary | ICD-10-CM | POA: Diagnosis not present

## 2012-02-13 DIAGNOSIS — Z9981 Dependence on supplemental oxygen: Secondary | ICD-10-CM

## 2012-02-13 DIAGNOSIS — D7289 Other specified disorders of white blood cells: Secondary | ICD-10-CM

## 2012-02-13 DIAGNOSIS — D638 Anemia in other chronic diseases classified elsewhere: Secondary | ICD-10-CM | POA: Diagnosis present

## 2012-02-13 HISTORY — DX: Unspecified osteoarthritis, unspecified site: M19.90

## 2012-02-13 LAB — DIFFERENTIAL
Basophils Absolute: 0 10*3/uL (ref 0.0–0.1)
Basophils Relative: 0 % (ref 0–1)
Eosinophils Absolute: 0 10*3/uL (ref 0.0–0.7)
Eosinophils Absolute: 0 10*3/uL (ref 0.0–0.7)
Lymphs Abs: 2.9 10*3/uL (ref 0.7–4.0)
Monocytes Absolute: 0.8 10*3/uL (ref 0.1–1.0)
Neutro Abs: 21.7 10*3/uL — ABNORMAL HIGH (ref 1.7–7.7)
Neutro Abs: 13.4 10*3/uL — ABNORMAL HIGH (ref 1.7–7.7)

## 2012-02-13 LAB — URINALYSIS, ROUTINE W REFLEX MICROSCOPIC
Bilirubin Urine: NEGATIVE
Hgb urine dipstick: NEGATIVE
Nitrite: NEGATIVE
Specific Gravity, Urine: 1.014 (ref 1.005–1.030)
Urobilinogen, UA: 0.2 mg/dL (ref 0.0–1.0)
pH: 6.5 (ref 5.0–8.0)

## 2012-02-13 LAB — CBC
HCT: 31.8 % — ABNORMAL LOW (ref 36.0–46.0)
HCT: 29.4 % — ABNORMAL LOW (ref 36.0–46.0)
Hemoglobin: 9.6 g/dL — ABNORMAL LOW (ref 12.0–15.0)
MCH: 28.9 pg (ref 26.0–34.0)
MCHC: 33 g/dL (ref 30.0–36.0)
MCHC: 32.7 g/dL (ref 30.0–36.0)
MCV: 87.8 fL (ref 78.0–100.0)
MCV: 88.6 fL (ref 78.0–100.0)
RDW: 16.8 % — ABNORMAL HIGH (ref 11.5–15.5)

## 2012-02-13 LAB — COMPREHENSIVE METABOLIC PANEL
ALT: 17 U/L (ref 0–35)
AST: 26 U/L (ref 0–37)
Albumin: 3.6 g/dL (ref 3.5–5.2)
Alkaline Phosphatase: 52 U/L (ref 39–117)
Alkaline Phosphatase: 45 U/L (ref 39–117)
BUN: 19 mg/dL (ref 6–23)
BUN: 18 mg/dL (ref 6–23)
Calcium: 9.3 mg/dL (ref 8.4–10.5)
Calcium: 8.6 mg/dL (ref 8.4–10.5)
Creatinine, Ser: 1.23 mg/dL — ABNORMAL HIGH (ref 0.50–1.10)
GFR calc Af Amer: 48 mL/min — ABNORMAL LOW (ref >90)
GFR calc Af Amer: 50 mL/min — ABNORMAL LOW (ref >90)
GFR calc non Af Amer: 42 mL/min — ABNORMAL LOW (ref >90)
Glucose, Bld: 206 mg/dL — ABNORMAL HIGH (ref 70–99)
Potassium: 4.2 mEq/L (ref 3.5–5.1)
Total Bilirubin: 0.3 mg/dL (ref 0.3–1.2)
Total Protein: 7.8 g/dL (ref 6.0–8.3)
Total Protein: 6.9 g/dL (ref 6.0–8.3)

## 2012-02-13 LAB — PROTIME-INR
INR: 0.91 (ref 0.00–1.49)
Prothrombin Time: 12.5 seconds (ref 11.6–15.2)

## 2012-02-13 LAB — ABO/RH: ABO/RH(D): A NEG

## 2012-02-13 LAB — PRO B NATRIURETIC PEPTIDE: Pro B Natriuretic peptide (BNP): 2650 pg/mL — ABNORMAL HIGH (ref 0–450)

## 2012-02-13 LAB — APTT: aPTT: 31 seconds (ref 24–37)

## 2012-02-13 MED ORDER — VANCOMYCIN HCL IN DEXTROSE 1-5 GM/200ML-% IV SOLN
1000.0000 mg | INTRAVENOUS | Status: DC
  Administered 2012-02-13 – 2012-02-14 (×2): 1000 mg via INTRAVENOUS
  Filled 2012-02-13 (×2): qty 200

## 2012-02-13 MED ORDER — ENOXAPARIN SODIUM 30 MG/0.3ML ~~LOC~~ SOLN: 30.0000 mg | Freq: Two times a day (BID) | SUBCUTANEOUS | Status: DC

## 2012-02-13 MED ORDER — TIOTROPIUM BROMIDE MONOHYDRATE 18 MCG IN CAPS
18.0000 ug | ORAL_CAPSULE | Freq: Every day | RESPIRATORY_TRACT | Status: DC
  Administered 2012-02-15 – 2012-02-22 (×8): 18 ug via RESPIRATORY_TRACT
  Filled 2012-02-13 (×3): qty 5

## 2012-02-13 MED ORDER — IPRATROPIUM BROMIDE 0.02 % IN SOLN
0.5000 mg | Freq: Four times a day (QID) | RESPIRATORY_TRACT | Status: DC | PRN
  Filled 2012-02-13: qty 2.5

## 2012-02-13 MED ORDER — PIPERACILLIN-TAZOBACTAM 3.375 G IVPB
3.3750 g | Freq: Three times a day (TID) | INTRAVENOUS | Status: DC
  Administered 2012-02-14 – 2012-02-15 (×3): 3.375 g via INTRAVENOUS
  Filled 2012-02-13 (×5): qty 50

## 2012-02-13 MED ORDER — ENOXAPARIN SODIUM 30 MG/0.3ML ~~LOC~~ SOLN
30.0000 mg | SUBCUTANEOUS | Status: DC
  Filled 2012-02-13: qty 0.3

## 2012-02-13 MED ORDER — PIPERACILLIN-TAZOBACTAM 3.375 G IVPB 30 MIN
3.3750 g | Freq: Once | INTRAVENOUS | Status: AC
  Administered 2012-02-13: 3.375 g via INTRAVENOUS
  Filled 2012-02-13: qty 50

## 2012-02-13 MED ORDER — HYDROCODONE-ACETAMINOPHEN 5-325 MG PO TABS: 1.0000 | ORAL_TABLET | Freq: Four times a day (QID) | ORAL | Status: DC | PRN

## 2012-02-13 MED ORDER — ONDANSETRON HCL 4 MG/2ML IJ SOLN
INTRAMUSCULAR | Status: AC
  Administered 2012-02-13: 4 mg
  Filled 2012-02-13: qty 2

## 2012-02-13 MED ORDER — SODIUM CHLORIDE 0.9 % IV SOLN
1000.0000 mL | INTRAVENOUS | Status: DC
  Administered 2012-02-13: 1000 mL via INTRAVENOUS

## 2012-02-13 MED ORDER — HYDROMORPHONE HCL PF 1 MG/ML IJ SOLN: 0.5000 mg | INTRAMUSCULAR | Status: AC | PRN

## 2012-02-13 MED ORDER — LEVALBUTEROL TARTRATE 45 MCG/ACT IN AERO
1.0000 | INHALATION_SPRAY | RESPIRATORY_TRACT | Status: DC | PRN
  Filled 2012-02-13: qty 15

## 2012-02-13 MED ORDER — AMIODARONE HCL 200 MG PO TABS
200.0000 mg | ORAL_TABLET | Freq: Every day | ORAL | Status: DC
  Administered 2012-02-15 – 2012-02-22 (×8): 200 mg via ORAL
  Filled 2012-02-13 (×9): qty 1

## 2012-02-13 MED ORDER — MORPHINE SULFATE 2 MG/ML IJ SOLN: 0.5000 mg | INTRAMUSCULAR | Status: DC | PRN

## 2012-02-13 MED ORDER — DOCUSATE SODIUM 100 MG PO CAPS
100.0000 mg | ORAL_CAPSULE | Freq: Two times a day (BID) | ORAL | Status: DC
  Administered 2012-02-13: 100 mg via ORAL
  Filled 2012-02-13 (×3): qty 1

## 2012-02-13 MED ORDER — ALBUTEROL SULFATE (5 MG/ML) 0.5% IN NEBU
2.5000 mg | INHALATION_SOLUTION | Freq: Four times a day (QID) | RESPIRATORY_TRACT | Status: DC | PRN
  Filled 2012-02-13: qty 0.5

## 2012-02-13 MED ORDER — FUROSEMIDE 40 MG PO TABS
40.0000 mg | ORAL_TABLET | Freq: Every day | ORAL | Status: DC
  Administered 2012-02-15: 40 mg via ORAL
  Filled 2012-02-13 (×3): qty 1

## 2012-02-13 MED ORDER — DILTIAZEM HCL ER COATED BEADS 180 MG PO CP24
180.0000 mg | ORAL_CAPSULE | Freq: Every day | ORAL | Status: DC
  Administered 2012-02-15 – 2012-02-22 (×8): 180 mg via ORAL
  Filled 2012-02-13 (×9): qty 1

## 2012-02-13 MED ORDER — POTASSIUM CHLORIDE CRYS ER 20 MEQ PO TBCR
20.0000 meq | EXTENDED_RELEASE_TABLET | Freq: Every day | ORAL | Status: DC
  Administered 2012-02-15: 20 meq via ORAL
  Filled 2012-02-13 (×3): qty 1

## 2012-02-13 MED ORDER — BUDESONIDE-FORMOTEROL FUMARATE 160-4.5 MCG/ACT IN AERO
2.0000 | INHALATION_SPRAY | Freq: Two times a day (BID) | RESPIRATORY_TRACT | Status: DC
  Administered 2012-02-13 – 2012-02-22 (×17): 2 via RESPIRATORY_TRACT
  Filled 2012-02-13: qty 6

## 2012-02-13 MED ORDER — MORPHINE SULFATE 4 MG/ML IJ SOLN
4.0000 mg | Freq: Once | INTRAMUSCULAR | Status: AC
  Administered 2012-02-13: 4 mg via INTRAVENOUS
  Filled 2012-02-13: qty 1

## 2012-02-13 MED ORDER — FENTANYL CITRATE 0.05 MG/ML IJ SOLN
100.0000 ug | Freq: Once | INTRAMUSCULAR | Status: AC
  Administered 2012-02-13: 100 ug via INTRAVENOUS
  Filled 2012-02-13: qty 2

## 2012-02-13 NOTE — Consult Note (Signed)
Ignatius Specking., MD  Chief Complaint: Left hip fracture History: Patient at home today when she tripped and fell.  No LOC.  Presents with inability to wt bear/ambulate.  Xrays in ER demonstrate left femoral neck fx.  As a result ortho consult requested. Past Medical History  Diagnosis Date  . CHF (congestive heart failure)   . COPD (chronic obstructive pulmonary disease)   . Dysrhythmia   . Arthritis     No Known Allergies  No current facility-administered medications on file prior to encounter.   Current Outpatient Prescriptions on File Prior to Encounter  Medication Sig Dispense Refill  . amiodarone (PACERONE) 200 MG tablet 200mg  BID for 2 weeks then 200mg  PO QD  60 tablet  3  . budesonide-formoterol (SYMBICORT) 160-4.5 MCG/ACT inhaler Inhale 2 puffs into the lungs 2 (two) times daily.      . Calcium Carbonate Antacid (TUMS PO) Take 1 tablet by mouth as needed. For heartburn/upset stomach      . diltiazem (CARDIZEM CD) 180 MG 24 hr capsule Take 1 capsule (180 mg total) by mouth daily.  30 capsule  3  . furosemide (LASIX) 40 MG tablet Take 1 tablet (40 mg total) by mouth daily.  30 tablet  3  . levalbuterol (XOPENEX HFA) 45 MCG/ACT inhaler Inhale 1-2 puffs into the lungs every 4 (four) hours as needed for wheezing or shortness of breath.  1 Inhaler  3  . potassium chloride SA (K-DUR,KLOR-CON) 20 MEQ tablet Take 1 tablet (20 mEq total) by mouth daily.  30 tablet  3  . tiotropium (SPIRIVA) 18 MCG inhalation capsule Place 18 mcg into inhaler and inhale daily.        Physical Exam: Filed Vitals:   02/13/12 2121  BP: 164/73  Pulse: 68  Temp: 97.6 F (36.4 C)  Resp: 18  no sob/cp abd soft/nt NVI Compartments soft/NT No laceration/abrasion EHL/TA/GA intact Sensation to LT intact Pain in left groin to palpation  Image: Dg Chest 1 View  02/13/2012  *RADIOLOGY REPORT*  Clinical Data: Left femoral neck fracture.  Preoperative respiratory evaluation.  Former smoker with history of  COPD and CHF.  CHEST - 1 VIEW  Comparison: None.  Findings: Cardiac silhouette enlarged.  Thoracic aorta atherosclerotic.  Hilar and mediastinal contours otherwise unremarkable.  Emphysematous changes throughout both lungs. Diffuse interstitial opacities which may represent chronic fibrosis, particularly at the right lung base.  No confluent airspace consolidation.  No visible pleural effusions.  IMPRESSION: Severe COPD/emphysema.  Cardiomegaly without pulmonary edema. Diffuse interstitial opacities which may be chronic and reflect interstitial fibrosis, particularly at the right lung base.  Original Report Authenticated By: Arnell Sieving, M.D.   Dg Hip Complete Left  02/13/2012  *RADIOLOGY REPORT*  Clinical Data: Fall.  Left hip injury with pain.  LEFT HIP - COMPLETE 2+ VIEW  Comparison: None.  Findings: Comminuted subcapital or basicervical left femoral neck fracture.  Mild inferomedial joint space narrowing.  Osteopenia. No other visible fractures.  Included AP pelvis demonstrates symmetric mild joint space narrowing in the right hip.  No fractures elsewhere involving the bony pelvis.  Sacroiliac joints intact with degenerative changes. Symphysis pubis intact.  Iliofemoral atherosclerosis.  IMPRESSION: Comminuted subcapital or basicervical left femoral neck fracture.  Original Report Authenticated By: Arnell Sieving, M.D.    A/P: S/p fall with femoral neck fracture Will require hemiarthroplasty Will discuss with my partner Admit to medicine per hip fracture protocal NPO after midnight tonight - ? Possible surgery tomorrow  Hold Lovenox  tonight as well.

## 2012-02-13 NOTE — H&P (Addendum)
[facility] History and Physical  Lynn Evans ZOX:[id number] DOB: Jan 08, 1934 DOA: 02/13/2012  Referring physician: ED, Dr. Patria Mane PCP: Ignatius Specking., MD   Chief Complaint: fall  HPI:  78 year old female with history of COPD/emphysema on home oxygen, Atrial fibrillation (not on coumadin) chronic diastolic CHF, HTN who presented to ED statu post fall at home after she has tripped over oxygen tube. Patient describes pain in her left hip, non radiating, 7-10 in intensity, unable to bear weight on this side. Patient reports not taking medications for pain. Pain started only status post fall. Patient denies chest pain, abdominal pain, nausea or vomiting. NO complaints of shortness of breath, cough, fever or chills.  Review of Systems:  Constitutional: Negative for fever, chills and malaise/fatigue. Negative for diaphoresis.  HENT: Negative for hearing loss, ear pain, nosebleeds, congestion, sore throat, neck pain, tinnitus and ear discharge.   Eyes: Negative for blurred vision, double vision, photophobia, pain, discharge and redness.  Respiratory: Negative for cough, hemoptysis, sputum production, shortness of breath, wheezing and stridor.   Cardiovascular: Negative for chest pain, palpitations, orthopnea, claudication and leg swelling.  Gastrointestinal: Negative for nausea, vomiting and abdominal pain. Negative for heartburn, constipation, blood in stool and melena.  Genitourinary: Negative for dysuria, urgency, frequency, hematuria and flank pain.  Musculoskeletal: per HPI  Skin: Negative for itching and rash.  Neurological: Negative for dizziness and weakness. Negative for tingling, tremors, sensory change, speech change, focal weakness, loss of consciousness and headaches.  Endo/Heme/Allergies: Negative for environmental allergies and polydipsia. Does not bruise/bleed easily.  Psychiatric/Behavioral: Negative for suicidal ideas. The patient is not nervous/anxious.      Past Medical  History  Diagnosis Date  . CHF (congestive heart failure)   . COPD (chronic obstructive pulmonary disease)    No past surgical history on file. Social History:  reports that she quit smoking about 2 months ago. Her smoking use included Cigarettes. She has a 57 pack-year smoking history. She has quit using smokeless tobacco. She reports that she does not drink alcohol or use illicit drugs.  No Known Allergies  Family History  Problem Relation Age of Onset  . Colon cancer Mother   . Cirrhosis Father     Prior to Admission medications   Medication Sig Start Date End Date Taking? Authorizing Provider  amiodarone (PACERONE) 200 MG tablet 200mg  BID for 2 weeks then 200mg  PO QD 12/22/11  Yes Jeanella Craze, NP  budesonide-formoterol (SYMBICORT) 160-4.5 MCG/ACT inhaler Inhale 2 puffs into the lungs 2 (two) times daily.   Yes Historical Provider, MD  Calcium Carbonate Antacid (TUMS PO) Take 1 tablet by mouth as needed. For heartburn/upset stomach   Yes Historical Provider, MD  diltiazem (CARDIZEM CD) 180 MG 24 hr capsule Take 1 capsule (180 mg total) by mouth daily. 12/22/11 12/21/12 Yes Jeanella Craze, NP  furosemide (LASIX) 40 MG tablet Take 1 tablet (40 mg total) by mouth daily. 12/22/11 12/21/12 Yes Jeanella Craze, NP  levalbuterol (XOPENEX HFA) 45 MCG/ACT inhaler Inhale 1-2 puffs into the lungs every 4 (four) hours as needed for wheezing or shortness of breath. 12/22/11 12/21/12 Yes Jeanella Craze, NP  potassium chloride SA (K-DUR,KLOR-CON) 20 MEQ tablet Take 1 tablet (20 mEq total) by mouth daily. 12/22/11 12/21/12 Yes Jeanella Craze, NP  tiotropium (SPIRIVA) 18 MCG inhalation capsule Place 18 mcg into inhaler and inhale daily.   Yes Historical Provider, MD   Physical Exam: Filed Vitals:   02/13/12 1546 02/13/12 1552 02/13/12 8119  BP: 182/80  160/58  Pulse: 70  65  Temp:   98.7 F (37.1 C)  TempSrc:   Oral  Resp: 16  16  Height:   5' (1.524 m)  Weight:   47.628 kg (105 lb)  SpO2: 99% 99%  100%   BP 160/58  Pulse 65  Temp 98.7 F (37.1 C) (Oral)  Resp 16  Ht 5' (1.524 m)  Wt 47.628 kg (105 lb)  BMI 20.51 kg/m2  SpO2 100%  General Appearance:    Alert, cooperative, no distress, appears stated age  Head:    Normocephalic, without obvious abnormality, atraumatic  Eyes:    PERRL, conjunctiva/corneas clear, EOM's intact, fundi    benign, both eyes  Ears:    Normal TM's and external ear canals, both ears  Nose:   Nares normal, septum midline, mucosa normal, no drainage    or sinus tenderness  Throat:   Lips, mucosa, and tongue normal; teeth and gums normal  Neck:   Supple, symmetrical, trachea midline, no adenopathy;    thyroid:  no enlargement/tenderness/nodules; no carotid   bruit or JVD  Back:     Symmetric, no curvature, ROM normal, no CVA tenderness  Lungs:     Clear to auscultation bilaterally, respirations unlabored   Heart:    Regular rate and rhythm, S1 and S2 normal, no JVD appreciated  Abdomen:     Soft, non-tender, bowel sounds active all four quadrants,    no masses, no organomegaly  Extremities:   Tenderness left hip area, pulses palpable  Pulses:   2+ and symmetric all extremities  Skin:   Skin color, texture, turgor normal, no rashes or lesions  Lymph nodes:   Cervical, supraclavicular, and axillary nodes normal  Neurologic:   CNII-XII intact, normal strength, sensation and reflexes    throughout     Labs on Admission:  Basic Metabolic Panel:  Lab 02/13/12 4098  NA 134*  K 4.3  CL 96  CO2 28  GLUCOSE 136*  BUN 19  CREATININE 1.23*  CALCIUM 9.3  MG --  PHOS --   Liver Function Tests:  Lab 02/13/12 1655  AST 26  ALT 17  ALKPHOS 52  BILITOT 0.3  PROT 7.8  ALBUMIN 3.6   CBC:  Lab 02/13/12 1655  WBC 26.2*  NEUTROABS 21.7*  HGB 10.5*  HCT 31.8*  MCV 87.8  PLT 327    Radiological Exams on Admission: Dg Chest 1 View 02/13/2012   IMPRESSION: Severe COPD/emphysema.  Cardiomegaly without pulmonary edema. Diffuse interstitial  opacities which may be chronic and reflect interstitial fibrosis, particularly at the right lung base.    Dg Hip Complete Left 02/13/2012  IMPRESSION: Comminuted subcapital or basicervical left femoral neck fracture.     EKG: Independently reviewed. Sinus, rhythm, tachycardia, no significant changes since prior ekg  Assessment/Plan  Principal Problem:  *Fracture of femoral neck, left - management per ortho - order set for femoral fracture on place  Active Problems: Leukocytosis - unclear etiology - urinalysis is normal, CXR with findings of severe COPD/emphysema but not really suspicious of pneumonia and per patient she was not on steroids - will start empiric vanco and zosyn but this may be perhaps discontinued if WBC count trending down and patient remains afebrile   COPD (chronic obstructive pulmonary disease) - stable - continue xopenex, symbicort and spiriva - O2 via nasal canula to keep O2 saturation above 90% - bronchodilators as needed   Acute on chronic diastolic congestive heart failure -  follow up BNP from this admission - 2 D ECHO in 11/2011 with EF 55 - 60% - for now continue lasix 40 mg PO as patient does not seem to be in acute CHF - daily weight - strict intake and output - correct electrolytes   HTN (hypertension) - may continue amiodarone and Cardizem   CKD (chronic kidney disease), stage III - creatinine in 11/2011 1.49 and 1.23 on this admission - continue to monitor renal function - continue lasix as it seems renal function around baseline   Anemia of chronic disease - secondary to CKD, stage III - hemoglobin on admission 10.5 - no signs of active bleed - no transfusion indicated at this time   Hyponatremia - secondary to CHF, chronic - sodium 134 on this admission - may continue lasix - amiodarone and cardizem for a fib   Atrial fibrillation - patient is on amiodarone and Cardizem for rate control - on patient's previous admission in  11/2011 she has declined anticoagulation and due to her age and renal insufficiency no other anticoagulants offered - EKG on this admission show sinus rhythm  Antibiotics: 1. Vanco 02/13/2012 --> 2. Zosyn 02/13/2012 -->  Consults: 1. Orthopedic surgery 2. Social work and case management for safe discharge planning  Code Status: full code Family Communication: updated at bedside Disposition Plan: pending surgery and PT evaluation  Manson Passey, MD  [facility] Pager [phone]  Time spent: 45 minutes  If 7PM-7AM, please contact night-coverage [email or web address] Password [facility] 02/13/2012, 7:13 PM

## 2012-02-13 NOTE — ED Notes (Signed)
MD at bedside. 

## 2012-02-13 NOTE — ED Notes (Signed)
Per EMS: Pt sustained fall while ambulating today on a hardwood floor. She tripped on her O2 tubing at about 1030 today with no initial pain, but as the day progressed, the pain began to get worse and son called EMS.

## 2012-02-13 NOTE — Progress Notes (Signed)
ANTIBIOTIC CONSULT NOTE - INITIAL  Pharmacy Consult for Vanco, Zosyn Indication: Leukocytosis  No Known Allergies  Patient Measurements: Height: 5' (152.4 cm) Weight: 105 lb (47.628 kg) IBW/kg (Calculated) : 45.5   Vital Signs: Temp: 98.7 F (37.1 C) (06/22 1602) Temp src: Oral (06/22 1602) BP: 150/61 mmHg (06/22 1953) Pulse Rate: 66  (06/22 1953) Intake/Output from previous day:   Intake/Output from this shift: Total I/O In: 120 [P.O.:120] Out: 2200 [Urine:2200]  Labs:  [facility] 02/13/12 1655  WBC 26.2*  HGB 10.5*  PLT 327  LABCREA --  CREATININE 1.23*   Estimated Creatinine Clearance: 27.9 ml/min (by C-G formula based on Cr of 1.23). No results found for this basename: VANCOTROUGH:2,VANCOPEAK:2,VANCORANDOM:2,GENTTROUGH:2,GENTPEAK:2,GENTRANDOM:2,TOBRATROUGH:2,TOBRAPEAK:2,TOBRARND:2,AMIKACINPEAK:2,AMIKACINTROU:2,AMIKACIN:2, in the last 72 hours   Microbiology: No results found for this or any previous visit (from the past 720 hour(s)).  Medical History: Past Medical History  Diagnosis Date  . CHF (congestive heart failure)   . COPD (chronic obstructive pulmonary disease)    Assessment:  48 YOF s/p fall presented with L femoral neck fracture . Ortho consulted.  Pt with leukocytosis (26.2K) and MD starting empiric antibiotic with Vancomycin and Zosyn.  Afebrile, Wt 47.6 kg, CrCl 28 ml/min, normalized 45 ml/min    Goal of Therapy:  Vancomycin trough level 15-20 mcg/ml  Plan:   Vancomycin 1gm IV q24h  Zosyn 3.375 gm IV q8h  F/u renal function, s/sx of infection and narrow antibiotics  Geoffry Paradise Thi 02/13/2012,7:55 PM

## 2012-02-13 NOTE — ED Notes (Signed)
ZOX:[id number]<AV> Expected date:02/13/12<BR> Expected time: 3:30 PM<BR> Means of arrival:Ambulance<BR> Comments:<BR> [place] 7. 78 f. Fall, hip pain from fall. 10 mins.

## 2012-02-13 NOTE — ED Provider Notes (Addendum)
History     CSN: [id number]  Arrival date & time 02/13/12  1545   First MD Initiated Contact with Patient 02/13/12 [phone]      Chief Complaint  Patient presents with  . Fall  . Hip Pain    (Consider location/radiation/quality/duration/timing/severity/associated sxs/prior treatment) The history is provided by the patient.   she reports today she tripped in her house on her oxygen tubing as she has oxygen dependent COPD.  She reports she fell onto her left hip has been unable to walk or move her left hip since then.  Her pain is moderate to severe at this time.  She denies neck pain or head injury.  She denies weakness of her upper extremities.  She denies chest pain or shortness of breath.  She has no abdominal pain.  She's had no recent illness.  She has a history of pulmonary HTN, COPD that is oxygen dependent and congestive heart failure.  Is worsened by palpation and movement of her left hip.  Nothing improves her pain.  Past Medical History  Diagnosis Date  . CHF (congestive heart failure)   . COPD (chronic obstructive pulmonary disease)     No past surgical history on file.  Family History  Problem Relation Age of Onset  . Colon cancer Mother   . Cirrhosis Father     History  Substance Use Topics  . Smoking status: Former Smoker -- 1.0 packs/day for 57 years    Types: Cigarettes    Quit date: 11/16/2011  . Smokeless tobacco: Former Neurosurgeon  . Alcohol Use: No    OB History    Grav Para Term Preterm Abortions TAB SAB Ect Mult Living                  Review of Systems  All other systems reviewed and are negative.    Allergies  Review of patient's allergies indicates no known allergies.  Home Medications   Current Outpatient Rx  Name Route Sig Dispense Refill  . AMIODARONE HCL 200 MG PO TABS  200mg  BID for 2 weeks then 200mg  PO QD 60 tablet 3  . BUDESONIDE-FORMOTEROL FUMARATE 160-4.5 MCG/ACT IN AERO Inhalation Inhale 2 puffs into the lungs 2 (two) times daily.     . TUMS PO Oral Take 1 tablet by mouth as needed. For heartburn/upset stomach    . DILTIAZEM HCL ER COATED BEADS 180 MG PO CP24 Oral Take 1 capsule (180 mg total) by mouth daily. 30 capsule 3  . FUROSEMIDE 40 MG PO TABS Oral Take 1 tablet (40 mg total) by mouth daily. 30 tablet 3  . LEVALBUTEROL TARTRATE 45 MCG/ACT IN AERO Inhalation Inhale 1-2 puffs into the lungs every 4 (four) hours as needed for wheezing or shortness of breath. 1 Inhaler 3  . POTASSIUM CHLORIDE CRYS ER 20 MEQ PO TBCR Oral Take 1 tablet (20 mEq total) by mouth daily. 30 tablet 3  . TIOTROPIUM BROMIDE MONOHYDRATE 18 MCG IN CAPS Inhalation Place 18 mcg into inhaler and inhale daily.      BP 160/58  Pulse 65  Temp 98.7 F (37.1 C) (Oral)  Resp 16  Ht 5' (1.524 m)  Wt 105 lb (47.628 kg)  BMI 20.51 kg/m2  SpO2 100%  Physical Exam  Nursing note and vitals reviewed. Constitutional: She is oriented to person, place, and time. She appears well-developed and well-nourished. No distress.  HENT:  Head: Normocephalic and atraumatic.  Eyes: EOM are normal.  Neck: Normal range of motion.  Cardiovascular: Normal rate, regular rhythm and normal heart sounds.   Pulmonary/Chest: Effort normal and breath sounds normal.  Abdominal: Soft. She exhibits no distension. There is no tenderness.  Musculoskeletal: Normal range of motion.       Left leg is shortened and externally rotated.  Patient has significant pain with range of motion of her left hip.  She has normal pulses her left foot  Neurological: She is alert and oriented to person, place, and time.  Skin: Skin is warm and dry.  Psychiatric: She has a normal mood and affect. Judgment normal.    ED Course  Procedures (including critical care time)  Labs Reviewed  CBC - Abnormal; Notable for the following:    WBC 26.2 (*)     RBC 3.62 (*)     Hemoglobin 10.5 (*)     HCT 31.8 (*)     RDW 16.8 (*)     All other components within normal limits  DIFFERENTIAL - Abnormal;  Notable for the following:    Neutrophils Relative 83 (*)     Lymphocytes Relative 11 (*)     Neutro Abs 21.7 (*)     Monocytes Absolute 1.6 (*)     All other components within normal limits  COMPREHENSIVE METABOLIC PANEL - Abnormal; Notable for the following:    Sodium 134 (*)     Glucose, Bld 136 (*)     Creatinine, Ser 1.23 (*)     GFR calc non Af Amer 42 (*)     GFR calc Af Amer 48 (*)     All other components within normal limits  TYPE AND SCREEN  URINALYSIS, ROUTINE W REFLEX MICROSCOPIC  PROTIME-INR  ABO/RH   Dg Chest 1 View  02/13/2012  *RADIOLOGY REPORT*  Clinical Data: Left femoral neck fracture.  Preoperative respiratory evaluation.  Former smoker with history of COPD and CHF.  CHEST - 1 VIEW  Comparison: None.  Findings: Cardiac silhouette enlarged.  Thoracic aorta atherosclerotic.  Hilar and mediastinal contours otherwise unremarkable.  Emphysematous changes throughout both lungs. Diffuse interstitial opacities which may represent chronic fibrosis, particularly at the right lung base.  No confluent airspace consolidation.  No visible pleural effusions.  IMPRESSION: Severe COPD/emphysema.  Cardiomegaly without pulmonary edema. Diffuse interstitial opacities which may be chronic and reflect interstitial fibrosis, particularly at the right lung base.  Original Report Authenticated By: Arnell Sieving, M.D.   Dg Hip Complete Left  02/13/2012  *RADIOLOGY REPORT*  Clinical Data: Fall.  Left hip injury with pain.  LEFT HIP - COMPLETE 2+ VIEW  Comparison: None.  Findings: Comminuted subcapital or basicervical left femoral neck fracture.  Mild inferomedial joint space narrowing.  Osteopenia. No other visible fractures.  Included AP pelvis demonstrates symmetric mild joint space narrowing in the right hip.  No fractures elsewhere involving the bony pelvis.  Sacroiliac joints intact with degenerative changes. Symphysis pubis intact.  Iliofemoral atherosclerosis.  IMPRESSION: Comminuted  subcapital or basicervical left femoral neck fracture.  Original Report Authenticated By: Arnell Sieving, M.D.    I personally reviewed the imaging tests through PACS system  I reviewed available ER/hospitalization records thought the EMR   1. Closed left hip fracture       MDM  Left femoral neck fracture.  The patient be admitted the hospital.  The orthopedist will be contacted.   5:04 PM Spoke with Dr Shon Baton orthopedics who will evaluate the patient        Lyanne Co, MD 02/13/12 1704  Lyanne Co, MD 02/13/12 1803   Date: 02/13/12  Rate: 64  Rhythm: normal sinus rhythm  QRS Axis: normal  Intervals: normal  ST/T Wave abnormalities: normal  Conduction Disutrbances: none  Narrative Interpretation:   Old EKG Reviewed: No significant changes noted     Lyanne Co, MD 03/12/12 2352

## 2012-02-14 ENCOUNTER — Encounter (HOSPITAL_COMMUNITY): Payer: Self-pay | Admitting: Registered Nurse

## 2012-02-14 ENCOUNTER — Inpatient Hospital Stay (HOSPITAL_COMMUNITY): Payer: Medicare Other

## 2012-02-14 ENCOUNTER — Inpatient Hospital Stay (HOSPITAL_COMMUNITY): Payer: Medicare Other | Admitting: Registered Nurse

## 2012-02-14 ENCOUNTER — Encounter (HOSPITAL_COMMUNITY)
Admission: EM | Disposition: A | Discharge status: Skilled Nursing Facility | Payer: Self-pay | Source: Home / Self Care | Attending: Internal Medicine

## 2012-02-14 DIAGNOSIS — I5032 Chronic diastolic (congestive) heart failure: Secondary | ICD-10-CM

## 2012-02-14 DIAGNOSIS — S72009A Fracture of unspecified part of neck of unspecified femur, initial encounter for closed fracture: Secondary | ICD-10-CM

## 2012-02-14 DIAGNOSIS — D7289 Other specified disorders of white blood cells: Secondary | ICD-10-CM

## 2012-02-14 DIAGNOSIS — R000 Tachycardia, unspecified: Secondary | ICD-10-CM

## 2012-02-14 HISTORY — PX: HIP ARTHROPLASTY: SHX981

## 2012-02-14 LAB — BASIC METABOLIC PANEL
GFR calc Af Amer: 53 mL/min — ABNORMAL LOW (ref >90)
GFR calc non Af Amer: 46 mL/min — ABNORMAL LOW (ref >90)
Glucose, Bld: 103 mg/dL — ABNORMAL HIGH (ref 70–99)
Potassium: 4.1 mEq/L (ref 3.5–5.1)
Sodium: 134 mEq/L — ABNORMAL LOW (ref 135–145)

## 2012-02-14 LAB — URINE MICROSCOPIC-ADD ON

## 2012-02-14 LAB — URINALYSIS, ROUTINE W REFLEX MICROSCOPIC
Nitrite: NEGATIVE
Specific Gravity, Urine: 1.019 (ref 1.005–1.030)
Urobilinogen, UA: 0.2 mg/dL (ref 0.0–1.0)

## 2012-02-14 LAB — CBC
Hemoglobin: 10.3 g/dL — ABNORMAL LOW (ref 12.0–15.0)
MCHC: 32.3 g/dL (ref 30.0–36.0)

## 2012-02-14 LAB — SURGICAL PCR SCREEN: MRSA, PCR: NEGATIVE

## 2012-02-14 SURGERY — HEMIARTHROPLASTY, HIP, DIRECT ANTERIOR APPROACH, FOR FRACTURE
Anesthesia: General | Site: Hip | Laterality: Left | Wound class: Clean

## 2012-02-14 MED ORDER — MENTHOL 3 MG MT LOZG
1.0000 | LOZENGE | OROMUCOSAL | Status: DC | PRN
  Filled 2012-02-14: qty 9

## 2012-02-14 MED ORDER — ENOXAPARIN SODIUM 40 MG/0.4ML ~~LOC~~ SOLN
40.0000 mg | SUBCUTANEOUS | Status: DC
  Administered 2012-02-17: 40 mg via SUBCUTANEOUS
  Filled 2012-02-14 (×8): qty 0.4

## 2012-02-14 MED ORDER — ACETAMINOPHEN 325 MG PO TABS
650.0000 mg | ORAL_TABLET | Freq: Four times a day (QID) | ORAL | Status: DC | PRN
  Administered 2012-02-18: 650 mg via ORAL
  Filled 2012-02-14: qty 2

## 2012-02-14 MED ORDER — ROCURONIUM BROMIDE 100 MG/10ML IV SOLN
INTRAVENOUS | Status: DC | PRN
  Administered 2012-02-14: 25 mg via INTRAVENOUS

## 2012-02-14 MED ORDER — ONDANSETRON HCL 4 MG/2ML IJ SOLN
4.0000 mg | Freq: Four times a day (QID) | INTRAMUSCULAR | Status: DC | PRN
  Administered 2012-02-20: 4 mg via INTRAVENOUS
  Filled 2012-02-14: qty 2

## 2012-02-14 MED ORDER — EPHEDRINE SULFATE 50 MG/ML IJ SOLN
INTRAMUSCULAR | Status: DC | PRN
  Administered 2012-02-14: 5 mg via INTRAVENOUS

## 2012-02-14 MED ORDER — CEFAZOLIN SODIUM 1-5 GM-% IV SOLN
INTRAVENOUS | Status: DC | PRN
  Administered 2012-02-14: 2 g via INTRAVENOUS

## 2012-02-14 MED ORDER — ACETAMINOPHEN 650 MG RE SUPP: 650.0000 mg | Freq: Four times a day (QID) | RECTAL | Status: DC | PRN

## 2012-02-14 MED ORDER — HYDROMORPHONE HCL PF 1 MG/ML IJ SOLN
INTRAMUSCULAR | Status: DC | PRN
Start: 2012-02-14 — End: 2012-02-14
  Administered 2012-02-14 (×2): .2 mg via INTRAVENOUS

## 2012-02-14 MED ORDER — ONDANSETRON HCL 4 MG PO TABS: 4.0000 mg | ORAL_TABLET | Freq: Four times a day (QID) | ORAL | Status: DC | PRN

## 2012-02-14 MED ORDER — METOCLOPRAMIDE HCL 5 MG/ML IJ SOLN: 5.0000 mg | Freq: Three times a day (TID) | INTRAMUSCULAR | Status: DC | PRN

## 2012-02-14 MED ORDER — FENTANYL CITRATE 0.05 MG/ML IJ SOLN
INTRAMUSCULAR | Status: DC | PRN
  Administered 2012-02-14 (×2): 50 ug via INTRAVENOUS

## 2012-02-14 MED ORDER — LACTATED RINGERS IV SOLN
INTRAVENOUS | Status: DC
  Administered 2012-02-14: 20 mL/h via INTRAVENOUS

## 2012-02-14 MED ORDER — METHOCARBAMOL 500 MG PO TABS
500.0000 mg | ORAL_TABLET | Freq: Four times a day (QID) | ORAL | Status: DC | PRN
  Administered 2012-02-15 (×2): 500 mg via ORAL
  Filled 2012-02-14 (×2): qty 1

## 2012-02-14 MED ORDER — DOCUSATE SODIUM 100 MG PO CAPS
100.0000 mg | ORAL_CAPSULE | Freq: Two times a day (BID) | ORAL | Status: DC
  Administered 2012-02-14 – 2012-02-16 (×5): 100 mg via ORAL
  Filled 2012-02-14 (×6): qty 1

## 2012-02-14 MED ORDER — PROPOFOL 10 MG/ML IV EMUL
INTRAVENOUS | Status: DC | PRN
  Administered 2012-02-14: 100 mg via INTRAVENOUS

## 2012-02-14 MED ORDER — MORPHINE SULFATE 2 MG/ML IJ SOLN
0.5000 mg | INTRAMUSCULAR | Status: DC | PRN
  Administered 2012-02-20 – 2012-02-21 (×2): 0.5 mg via INTRAVENOUS
  Filled 2012-02-14 (×2): qty 1

## 2012-02-14 MED ORDER — 0.9 % SODIUM CHLORIDE (POUR BTL) OPTIME
TOPICAL | Status: DC | PRN
  Administered 2012-02-14: 1000 mL

## 2012-02-14 MED ORDER — METOCLOPRAMIDE HCL 10 MG PO TABS: 5.0000 mg | ORAL_TABLET | Freq: Three times a day (TID) | ORAL | Status: DC | PRN

## 2012-02-14 MED ORDER — METHOCARBAMOL 100 MG/ML IJ SOLN
500.0000 mg | Freq: Four times a day (QID) | INTRAVENOUS | Status: DC | PRN
  Filled 2012-02-14: qty 5

## 2012-02-14 MED ORDER — ONDANSETRON HCL 4 MG/2ML IJ SOLN
INTRAMUSCULAR | Status: DC | PRN
  Administered 2012-02-14: 4 mg via INTRAVENOUS

## 2012-02-14 MED ORDER — CHLORHEXIDINE GLUCONATE 4 % EX LIQD
60.0000 mL | Freq: Once | CUTANEOUS | Status: DC
  Filled 2012-02-14: qty 60

## 2012-02-14 MED ORDER — LACTATED RINGERS IV SOLN
INTRAVENOUS | Status: DC | PRN
  Administered 2012-02-14: 09:00:00 via INTRAVENOUS

## 2012-02-14 MED ORDER — HYDROCODONE-ACETAMINOPHEN 5-325 MG PO TABS
1.0000 | ORAL_TABLET | Freq: Four times a day (QID) | ORAL | Status: DC | PRN
  Administered 2012-02-15 – 2012-02-17 (×4): 1 via ORAL
  Filled 2012-02-14: qty 2
  Filled 2012-02-14 (×3): qty 1

## 2012-02-14 MED ORDER — NEOSTIGMINE METHYLSULFATE 1 MG/ML IJ SOLN
INTRAMUSCULAR | Status: DC | PRN
  Administered 2012-02-14: 2.5 mg via INTRAVENOUS

## 2012-02-14 MED ORDER — HYDROMORPHONE HCL PF 1 MG/ML IJ SOLN: 0.2500 mg | INTRAMUSCULAR | Status: DC | PRN

## 2012-02-14 MED ORDER — GLYCOPYRROLATE 0.2 MG/ML IJ SOLN
INTRAMUSCULAR | Status: DC | PRN
  Administered 2012-02-14: .3 mg via INTRAVENOUS

## 2012-02-14 MED ORDER — LIDOCAINE HCL (CARDIAC) 20 MG/ML IV SOLN
INTRAVENOUS | Status: DC | PRN
  Administered 2012-02-14: 50 mg via INTRAVENOUS

## 2012-02-14 MED ORDER — PHENOL 1.4 % MT LIQD
1.0000 | OROMUCOSAL | Status: DC | PRN
  Filled 2012-02-14: qty 177

## 2012-02-14 SURGICAL SUPPLY — 45 items
BAG ZIPLOCK 12X15 (MISCELLANEOUS) ×2 IMPLANT
BLADE SAW SAG 73X25 THK (BLADE) ×1
BLADE SAW SGTL 73X25 THK (BLADE) ×1 IMPLANT
BRUSH FEMORAL CANAL (MISCELLANEOUS) IMPLANT
CLOTH BEACON ORANGE TIMEOUT ST (SAFETY) ×2 IMPLANT
CLSR STERI-STRIP ANTIMIC 1/2X4 (GAUZE/BANDAGES/DRESSINGS) ×2 IMPLANT
DRAPE INCISE IOBAN 66X45 STRL (DRAPES) ×2 IMPLANT
DRAPE ORTHO 2.5IN SPLIT 77X108 (DRAPES) ×2 IMPLANT
DRAPE ORTHO SPLIT 77X108 STRL (DRAPES) ×2
DRAPE POUCH INSTRU U-SHP 10X18 (DRAPES) ×2 IMPLANT
DRAPE U-SHAPE 47X51 STRL (DRAPES) ×2 IMPLANT
DRSG EMULSION OIL 3X16 NADH (GAUZE/BANDAGES/DRESSINGS) ×2 IMPLANT
DRSG MEPILEX BORDER 4X4 (GAUZE/BANDAGES/DRESSINGS) ×2 IMPLANT
DRSG MEPILEX BORDER 4X8 (GAUZE/BANDAGES/DRESSINGS) ×2 IMPLANT
DRSG PAD ABDOMINAL 8X10 ST (GAUZE/BANDAGES/DRESSINGS) ×2 IMPLANT
DURAPREP 26ML APPLICATOR (WOUND CARE) ×2 IMPLANT
ELECT REM PT RETURN 9FT ADLT (ELECTROSURGICAL) ×2
ELECTRODE REM PT RTRN 9FT ADLT (ELECTROSURGICAL) ×1 IMPLANT
EVACUATOR 1/8 PVC DRAIN (DRAIN) IMPLANT
GLOVE BIO SURGEON STRL SZ7.5 (GLOVE) ×2 IMPLANT
GLOVE BIO SURGEON STRL SZ8 (GLOVE) ×2 IMPLANT
GLOVE ECLIPSE 7.5 STRL STRAW (GLOVE) ×2 IMPLANT
GLOVE EUDERMIC 7 POWDERFREE (GLOVE) ×2 IMPLANT
HANDPIECE INTERPULSE COAX TIP (DISPOSABLE)
IMMOBILIZER KNEE 20 (SOFTGOODS) ×2
IMMOBILIZER KNEE 20 THIGH 36 (SOFTGOODS) ×1 IMPLANT
MANIFOLD NEPTUNE II (INSTRUMENTS) ×2 IMPLANT
NEEDLE MA TROC 1/2 (NEEDLE) ×2 IMPLANT
PACK TOTAL JOINT (CUSTOM PROCEDURE TRAY) ×2 IMPLANT
PASSER SUT SWANSON 36MM LOOP (INSTRUMENTS) ×2 IMPLANT
PILLOW ABDUCTION HIP (SOFTGOODS) ×2 IMPLANT
POSITIONER SURGICAL ARM (MISCELLANEOUS) ×2 IMPLANT
SET HNDPC FAN SPRY TIP SCT (DISPOSABLE) IMPLANT
SPONGE GAUZE 4X4 12PLY (GAUZE/BANDAGES/DRESSINGS) ×4 IMPLANT
SPONGE LAP 4X18 X RAY DECT (DISPOSABLE) IMPLANT
STAPLER VISISTAT 35W (STAPLE) ×2 IMPLANT
SUT ETHIBOND NAB CT1 #1 30IN (SUTURE) ×8 IMPLANT
SUT MNCRL AB 3-0 PS2 18 (SUTURE) ×2 IMPLANT
SUT VIC AB 0 CT1 27 (SUTURE) ×2
SUT VIC AB 0 CT1 27XBRD ANTBC (SUTURE) ×2 IMPLANT
SUT VIC AB 1 CT1 36 (SUTURE) ×4 IMPLANT
SUT VIC AB 2-0 CT1 27 (SUTURE) ×2
SUT VIC AB 2-0 CT1 TAPERPNT 27 (SUTURE) ×2 IMPLANT
TOWER CARTRIDGE SMART MIX (DISPOSABLE) IMPLANT
TRAY FOLEY CATH 14FRSI W/METER (CATHETERS) IMPLANT

## 2012-02-14 NOTE — Progress Notes (Signed)
Lynn Evans  MRN: [id number] DOB/Age: Mar 14, 1934 78 y.o. Physician: Lynnea Maizes, M.D. Day of Surgery Procedure(s) (LRB): ARTHROPLASTY BIPOLAR HIP (Left)  Subjective: C/O left hip pain Vital Signs Temp:  [97.4 F (36.3 C)-98.7 F (37.1 C)] 97.4 F (36.3 C) (06/23 4098) Pulse Rate:  [65-70] 67  (06/23 0608) Resp:  [16-18] 18  (06/23 0608) BP: (139-182)/(58-80) 139/65 mmHg (06/23 0608) SpO2:  [92 %-100 %] 92 % (06/23 0608) Weight:  [47.628 kg (105 lb)-47.9 kg (105 lb 9.6 oz)] 47.9 kg (105 lb 9.6 oz) (06/22 2121)  Lab Results  Basename 02/14/12 0514 02/13/12 2155  WBC 13.3* 15.1*  HGB 10.3* 9.6*  HCT 31.9* 29.4*  PLT 257 238   BMET  Basename 02/14/12 0514 02/13/12 2155  NA 134* 133*  K 4.1 4.2  CL 95* 95*  CO2 31 31  GLUCOSE 103* 206*  BUN 17 18  CREATININE 1.14* 1.19*  CALCIUM 8.8 8.6   INR  Date Value Range Status  02/13/2012 1.04  0.00 - 1.49 Final     Exam  Left hip diffusely tender with pain on attempts at ROM. Grossly N/V intact distally  Plan I have discussed with patient and her daughter treatment options and risks vs benefits thereof. They understand and accept and agree with plan for hemiarthroplasty. To OR this AM. Ahja Martello M 02/14/2012, 8:33 AM

## 2012-02-14 NOTE — Progress Notes (Signed)
CSW provided list of bed offers for SNF facilities.   Weekday CSW to f/u for bed selection and further d/c planning.    Kathy Wahid, LCSWA [facility] Weekend Coverage [phone]    

## 2012-02-14 NOTE — Anesthesia Postprocedure Evaluation (Signed)
  Anesthesia Post-op Note  Patient: Lynn Evans  Procedure(s) Performed: Procedure(s) (LRB): ARTHROPLASTY BIPOLAR HIP (Left)  Patient Location: PACU  Anesthesia Type: General  Level of Consciousness: oriented and sedated  Airway and Oxygen Therapy: Patient Spontanous Breathing and Patient connected to nasal cannula oxygen  Post-op Pain: mild  Post-op Assessment: Post-op Vital signs reviewed, Patient's Cardiovascular Status Stable, Respiratory Function Stable and Patent Airway  Post-op Vital Signs: stable  Complications: No apparent anesthesia complications

## 2012-02-14 NOTE — Op Note (Signed)
02/13/2012 - 02/14/2012  10:52 AM  PATIENT:   Lynn Evans  78 y.o. female  PRE-OPERATIVE DIAGNOSIS:  left femoral neck fracture  POST-OPERATIVE DIAGNOSIS:  same  PROCEDURE:  Left hip hemiarthroplasty #5 press fit stem, +0 neck, 44 uni head  SURGEON:  Kaien Pezzullo, Vania Rea M.D.  ASSISTANTS: Shuford pac   ANESTHESIA:   get  EBL: 200  SPECIMEN:  none  Drains: none   PATIENT DISPOSITION:  PACU - hemodynamically stable.    PLAN OF CARE: Admit to inpatient  WBAT LLE, total hip precautions lovenox while inpatient, then ASA as outpatient x3 weeks ABx per medicine. Orthopaedically request 24 hr F/u 3 weeks post op SW consult for dc planning   Dictation# [id number]

## 2012-02-14 NOTE — Transfer of Care (Signed)
Immediate Anesthesia Transfer of Care Note  Patient: Lynn Evans  Procedure(s) Performed: Procedure(s) (LRB): ARTHROPLASTY BIPOLAR HIP (Left)  Patient Location: PACU  Anesthesia Type: General  Level of Consciousness: awake, alert , oriented and patient cooperative  Airway & Oxygen Therapy: Patient Spontanous Breathing and Patient connected to face mask oxygen  Post-op Assessment: Report given to PACU RN, Post -op Vital signs reviewed and stable and Patient moving all extremities  Post vital signs: Reviewed and stable  Complications: No apparent anesthesia complications

## 2012-02-14 NOTE — Progress Notes (Addendum)
[facility]                                                                                Patient Demographics  Lynn Evans, is a 78 y.o. female  RUE:[id number]  JYN:[id number]  DOB - November 21, 1933  Admit date - 02/13/2012  Admitting Physician Leroy Sea, MD  Outpatient Primary MD for the patient is VYAS,DHRUV B., MD  LOS - 1   Chief Complaint  Patient presents with  . Fall  . Hip Pain        Subjective:   Lynn Evans today has, No headache, No chest pain, No abdominal pain - No Nausea, No new weakness tingling or numbness, No Cough - SOB.    Objective:   Filed Vitals:   02/14/12 1245 02/14/12 1300 02/14/12 1327 02/14/12 1529  BP: 159/64 158/57 138/57 119/64  Pulse: 68 66 71 74  Temp:   97.4 F (36.3 C) 98.6 F (37 C)  TempSrc:   Oral Oral  Resp: 14 15 16 18   Height:      Weight:      SpO2: 91% 100% 93% 95%    Wt Readings from Last 3 Encounters:  02/13/12 47.9 kg (105 lb 9.6 oz)  02/13/12 47.9 kg (105 lb 9.6 oz)  12/30/11 45.088 kg (99 lb 6.4 oz)     Intake/Output Summary (Last 24 hours) at 02/14/12 1631 Last data filed at 02/14/12 1500  Gross per 24 hour  Intake   2020 ml  Output   2895 ml  Net   -875 ml    Exam Awake Alert, Oriented *3, No new F.N deficits, Normal affect [place].AT,PERRAL Supple Neck,No JVD, No cervical lymphadenopathy appriciated.  Symmetrical Chest wall movement, Good air movement bilaterally, CTAB RRR,No Gallops,Rubs or new Murmurs, No Parasternal Heave +ve B.Sounds, Abd Soft, Non tender, No organomegaly appriciated, No rebound -guarding or rigidity. No Cyanosis, Clubbing or edema, No new Rash or bruise    Data Review  CBC  Lab 02/14/12 0514 02/13/12 2155 02/13/12 1655  WBC 13.3* 15.1* 26.2*  HGB 10.3* 9.6* 10.5*  HCT 31.9* 29.4* 31.8*  PLT 257 238 327  MCV 89.1 88.6 87.8  MCH 28.8 28.9 29.0  MCHC 32.3 32.7 33.0  RDW 16.8* 16.6* 16.8*  LYMPHSABS -- 0.9 2.9  MONOABS -- 0.8 1.6*  EOSABS -- 0.0  0.0  BASOSABS -- 0.0 0.0  BANDABS -- -- --    Chemistries   Lab 02/14/12 0514 02/13/12 2155 02/13/12 1655  NA 134* 133* 134*  K 4.1 4.2 4.3  CL 95* 95* 96  CO2 31 31 28   GLUCOSE 103* 206* 136*  BUN 17 18 19   CREATININE 1.14* 1.19* 1.23*  CALCIUM 8.8 8.6 9.3  MG -- -- --  AST -- 22 26  ALT -- 16 17  ALKPHOS -- 45 52  BILITOT -- 0.4 0.3   ------------------------------------------------------------------------------------------------------------------ estimated creatinine clearance is 30.2 ml/min (by C-G formula based on Cr of 1.14). ------------------------------------------------------------------------------------------------------------------ No results found for this basename: HGBA1C:2 in the last 72 hours ------------------------------------------------------------------------------------------------------------------ No results found for this basename: CHOL:2,HDL:2,LDLCALC:2,TRIG:2,CHOLHDL:2,LDLDIRECT:2 in the last 72 hours ------------------------------------------------------------------------------------------------------------------ No results found for this basename: TSH,T4TOTAL,FREET3,T3FREE,THYROIDAB in the last  72 hours ------------------------------------------------------------------------------------------------------------------ No results found for this basename: VITAMINB12:2,FOLATE:2,FERRITIN:2,TIBC:2,IRON:2,RETICCTPCT:2 in the last 72 hours  Coagulation profile  Lab 02/13/12 2155 02/13/12 1655  INR 1.04 0.91  PROTIME -- --    No results found for this basename: DDIMER:2 in the last 72 hours  Cardiac Enzymes No results found for this basename: CK:3,CKMB:3,TROPONINI:3,MYOGLOBIN:3 in the last 168 hours ------------------------------------------------------------------------------------------------------------------ No components found with this basename: POCBNP:3  Micro Results Recent Results (from the past 240 hour(s))  SURGICAL PCR SCREEN      Status: Normal   Collection Time   02/14/12  7:11 AM      Component Value Range Status Comment   MRSA, PCR NEGATIVE  NEGATIVE Final    Staphylococcus aureus NEGATIVE  NEGATIVE Final     Radiology Reports Dg Chest 1 View  02/13/2012  *RADIOLOGY REPORT*  Clinical Data: Left femoral neck fracture.  Preoperative respiratory evaluation.  Former smoker with history of COPD and CHF.  CHEST - 1 VIEW  Comparison: None.  Findings: Cardiac silhouette enlarged.  Thoracic aorta atherosclerotic.  Hilar and mediastinal contours otherwise unremarkable.  Emphysematous changes throughout both lungs. Diffuse interstitial opacities which may represent chronic fibrosis, particularly at the right lung base.  No confluent airspace consolidation.  No visible pleural effusions.  IMPRESSION: Severe COPD/emphysema.  Cardiomegaly without pulmonary edema. Diffuse interstitial opacities which may be chronic and reflect interstitial fibrosis, particularly at the right lung base.  Original Report Authenticated By: Arnell Sieving, M.D.   Dg Hip 1 View Left  02/14/2012  *RADIOLOGY REPORT*  Clinical Data: Postop  LEFT HIP - 1 VIEW:  Comparison: Yesterday  Findings: Left hip hemiarthroplasty is in place.  Anatomic alignment of the osseous and prosthetic structures.  No breakage or loosening of the hardware.  IMPRESSION: Left hip hemiarthroplasty anatomically aligned.  Original Report Authenticated By: Donavan Burnet, M.D.   Dg Hip Complete Left  02/13/2012  *RADIOLOGY REPORT*  Clinical Data: Fall.  Left hip injury with pain.  LEFT HIP - COMPLETE 2+ VIEW  Comparison: None.  Findings: Comminuted subcapital or basicervical left femoral neck fracture.  Mild inferomedial joint space narrowing.  Osteopenia. No other visible fractures.  Included AP pelvis demonstrates symmetric mild joint space narrowing in the right hip.  No fractures elsewhere involving the bony pelvis.  Sacroiliac joints intact with degenerative changes. Symphysis pubis  intact.  Iliofemoral atherosclerosis.  IMPRESSION: Comminuted subcapital or basicervical left femoral neck fracture.  Original Report Authenticated By: Arnell Sieving, M.D.   Dg Pelvis Portable  02/14/2012  *RADIOLOGY REPORT*  Clinical Data: Postop  PORTABLE PELVIS  Comparison: Yesterday  Findings: Left hip hemiarthroplasty is in place.  There is anatomic alignment of the osseous and prosthetic structures.  No breakage or loosening of the hardware.  IMPRESSION: Left hip hemiarthroplasty anatomically aligned.  Original Report Authenticated By: Donavan Burnet, M.D.    Scheduled Meds:   . amiodarone  200 mg Oral Daily  . budesonide-formoterol  2 puff Inhalation BID  . diltiazem  180 mg Oral Daily  . docusate sodium  100 mg Oral BID  . enoxaparin  40 mg Subcutaneous Q24H  . fentaNYL  100 mcg Intravenous Once  . furosemide  40 mg Oral Daily  .  morphine injection  4 mg Intravenous Once  . ondansetron      . piperacillin-tazobactam  3.375 g Intravenous Once  . piperacillin-tazobactam (ZOSYN)  IV  3.375 g Intravenous Q8H  . potassium chloride SA  20 mEq Oral Daily  .  tiotropium  18 mcg Inhalation Daily  . vancomycin  1,000 mg Intravenous Q24H  . DISCONTD: chlorhexidine  60 mL Topical Once  . DISCONTD: docusate sodium  100 mg Oral BID  . DISCONTD: enoxaparin  30 mg Subcutaneous Q12H  . DISCONTD: enoxaparin (LOVENOX) injection  30 mg Subcutaneous Q24H   Continuous Infusions:   . lactated ringers    . DISCONTD: sodium chloride Stopped (02/13/12 1811)   PRN Meds:.acetaminophen, acetaminophen, albuterol, HYDROcodone-acetaminophen, HYDROmorphone (DILAUDID) injection, ipratropium, levalbuterol, menthol-cetylpyridinium, methocarbamol (ROBAXIN) IV, methocarbamol, metoCLOPramide (REGLAN) injection, metoCLOPramide, morphine injection, ondansetron (ZOFRAN) IV, ondansetron, phenol, DISCONTD: 0.9 % irrigation (POUR BTL), DISCONTD: HYDROcodone-acetaminophen, DISCONTD:  HYDROmorphone (DILAUDID)  injection DISCONTD:  morphine injection  Assessment & Plan    1. The chemical fall causing Comminuted subcapital or basicervical left femoral neck fracture status post Left hip hemiarthroplasty #5 press fit stem, +0 neck, 44 uni head by Dr. supple on 02/14/2012 - patient will receive PT OT, stop care per orthopedics, patient has persistently refused any chemical prophylaxis as she says even 81 mg of aspirin makes her nose bleed, she was counseled about the risks of DVT PE death and disability however she continues to refuse any chemical DVT prophylaxis. We'll provide her with SCDs,  stop her IV fluids as she is eating appropriately, incentive spirometry every hour while awake.   2. History of atrial fibrillation patient not on Coumadin at home. Plan will be rate control home dose Cardizem and amiodarone will be continued    3. Leukocytosis with chest x-ray changes of acute versus chronic interstitial changes- patient on empiric Vanco and Zosyn since admission, white count is improving, will clinically monitor and taper antibiotics as appropriate. Repeat chest x-ray in the morning.    4. COPD and hypertension stable continue home medications no acute issues.    5. Anemia of chronic disease will monitor in the morning post op.     DVT Prophylaxis -  SCDs - patient has persistently refused any chemical prophylaxis as she says even 81 mg of aspirin makes her nose bleed, she was counseled about the risks of DVT PE death and disability however she continues to refuse any chemical DVT prophylaxis. Lovenox has been ordered if patient changes mind she will get it.     Procedures - Left hip hemiarthroplasty #5 press fit stem, +0 neck, 44 uni head by Dr. supple on 02/14/2012   Consults Ortho-S work   Leroy Sea M.D on 02/14/2012 at 4:31 PM  Between 7am to 7pm - Pager - [phone]  After 7pm go to [email or web address] - password [email or web address]  And look for the night coverage person covering for  me after hours  [facility] Office  [phone]

## 2012-02-14 NOTE — Progress Notes (Signed)
Left a message on Saanvi Veron's voicemail (pt daughter), making her aware that Dr. Rennis Chris will be here to speak to pt in about 30-40 min. Pt refusing to sign consent as she is unsure about surgery, Dr. Rennis Chris aware.

## 2012-02-14 NOTE — Anesthesia Preprocedure Evaluation (Addendum)
Anesthesia Evaluation  Patient identified by MRN, date of birth, ID band Patient awake  General Assessment Comment:Npo today  Reviewed: Allergy & Precautions, H&P , NPO status , Patient's Chart, lab work & pertinent test results, reviewed documented beta blocker date and time   History of Anesthesia Complications (+) AWARENESS UNDER ANESTHESIA  Airway Mallampati: II TM Distance: >3 FB Neck ROM: Full    Dental  (+) Teeth Intact, Poor Dentition and Dental Advisory Given   Pulmonary former smoker breath sounds clear to auscultation        Cardiovascular hypertension, negative cardio ROS  + dysrhythmias Atrial Fibrillation Rhythm:Regular Rate:Normal     Neuro/Psych negative neurological ROS  negative psych ROS   GI/Hepatic negative GI ROS, Neg liver ROS,   Endo/Other  negative endocrine ROS  Renal/GU Mildly elevated Cr 1.14  negative genitourinary   Musculoskeletal   Abdominal   Peds negative pediatric ROS (+)  Hematology Anemia, Hgb 10.3   Anesthesia Other Findings   Reproductive/Obstetrics negative OB ROS                          Anesthesia Physical Anesthesia Plan  ASA: III  Anesthesia Plan: General   Post-op Pain Management:    Induction: Intravenous  Airway Management Planned: Oral ETT  Additional Equipment:   Intra-op Plan:   Post-operative Plan:   Informed Consent: I have reviewed the patients History and Physical, chart, labs and discussed the procedure including the risks, benefits and alternatives for the proposed anesthesia with the patient or authorized representative who has indicated his/her understanding and acceptance.   Dental advisory given  Plan Discussed with: CRNA and Surgeon  Anesthesia Plan Comments: (Pt refuses regional)        Anesthesia Quick Evaluation

## 2012-02-14 NOTE — Progress Notes (Signed)
INITIAL ADULT NUTRITION ASSESSMENT Date: 02/14/2012   Time: 9:11 AM Reason for Assessment: CHF diet ed, hip fx  ASSESSMENT: Female 78 y.o.  Dx: Fracture of femoral neck, left, leukocytosis, COPD, CHF, Anemia, Hyponatremia, A fib  Hx:  Past Medical History  Diagnosis Date  . CHF (congestive heart failure)   . COPD (chronic obstructive pulmonary disease)   . Dysrhythmia   . Arthritis    Past Surgical History  Procedure Date  . Hemorrhoid surgery     Related Meds: colace, zosyn, KCl, vancocin  Ht: 5' (152.4 cm)  Wt: 105 lb 9.6 oz (47.9 kg)  Ideal Wt: 45.5 kg  % Ideal Wt: 104  Usual Wt:  Wt Readings from Last 10 Encounters:  02/13/12 105 lb 9.6 oz (47.9 kg)  02/13/12 105 lb 9.6 oz (47.9 kg)  12/30/11 99 lb 6.4 oz (45.088 kg)  12/22/11 105 lb 14.4 oz (48.036 kg)    % Usual Wt: 100  Body mass index is 20.62 kg/(m^2).  Food/Nutrition Related Hx: unknown  Labs:  CMP     Component Value Date/Time   NA 134* 02/14/2012 0514   K 4.1 02/14/2012 0514   CL 95* 02/14/2012 0514   CO2 31 02/14/2012 0514   GLUCOSE 103* 02/14/2012 0514   BUN 17 02/14/2012 0514   CREATININE 1.14* 02/14/2012 0514   CALCIUM 8.8 02/14/2012 0514   PROT 6.9 02/13/2012 2155   ALBUMIN 3.2* 02/13/2012 2155   AST 22 02/13/2012 2155   ALT 16 02/13/2012 2155   ALKPHOS 45 02/13/2012 2155   BILITOT 0.4 02/13/2012 2155   GFRNONAA 46* 02/14/2012 0514   GFRAA 53* 02/14/2012 0514    I/O last 3 completed shifts: In: 320 [P.O.:120; IV Piggyback:200] Out: 2550 [Urine:2550]    Diet Order: NPO  Supplements/Tube Feeding:  none  IVF:    DISCONTD: sodium chloride Last Rate: Stopped (02/13/12 1811)    Estimated Nutritional Needs:   Kcal: 1400-1500  Protein: 60-70 Fluid: .1.4L  Wt WNL.  No recent loss Post hip surgery.  NPO.  NUTRITION DIAGNOSIS: -Inadequate oral intake (NI-2.1).  Status: Ongoing  RELATED TO: post op  AS EVIDENCE BY: npo  MONITORING/EVALUATION(Goals): Monitor:  Diet Advancement, labs,  intake, wt trend Goal:Diet advancement with intake >75% meals  EDUCATION NEEDS: -Education not appropriate at this time  INTERVENTION: See for supplement needs after diet advanced.   Diet education as appropriate.  Dietitian [phone]  DOCUMENTATION CODES Per approved criteria  -Not Applicable    Lynn Evans 02/14/2012, 9:11 AM

## 2012-02-14 NOTE — Preoperative (Signed)
Beta Blockers   Reason not to administer Beta Blockers:Not Applicable 

## 2012-02-14 NOTE — Progress Notes (Signed)
Pt sitting up in bed, consuming clear liquid diet, very verbal, pleasant, very talkative. Pt states that on last hospital admission she was given "shot to thin blood" and started to vaginal bleed. Dr. Thedore Mins made aware.

## 2012-02-15 DIAGNOSIS — R000 Tachycardia, unspecified: Secondary | ICD-10-CM

## 2012-02-15 DIAGNOSIS — S72009A Fracture of unspecified part of neck of unspecified femur, initial encounter for closed fracture: Secondary | ICD-10-CM

## 2012-02-15 DIAGNOSIS — I5032 Chronic diastolic (congestive) heart failure: Secondary | ICD-10-CM

## 2012-02-15 DIAGNOSIS — D7289 Other specified disorders of white blood cells: Secondary | ICD-10-CM

## 2012-02-15 LAB — CBC
HCT: 29.7 % — ABNORMAL LOW (ref 36.0–46.0)
MCH: 28.9 pg (ref 26.0–34.0)
MCHC: 34.3 g/dL (ref 30.0–36.0)
MCV: 88.4 fL (ref 78.0–100.0)
MCV: 85.6 fL (ref 78.0–100.0)
Platelets: 178 10*3/uL (ref 150–400)
Platelets: 168 10*3/uL (ref 150–400)
RDW: 17 % — ABNORMAL HIGH (ref 11.5–15.5)
RDW: 17 % — ABNORMAL HIGH (ref 11.5–15.5)

## 2012-02-15 LAB — URINE CULTURE

## 2012-02-15 LAB — BASIC METABOLIC PANEL
BUN: 19 mg/dL (ref 6–23)
CO2: 30 mEq/L (ref 19–32)
Calcium: 8 mg/dL — ABNORMAL LOW (ref 8.4–10.5)
Creatinine, Ser: 1.22 mg/dL — ABNORMAL HIGH (ref 0.50–1.10)
GFR calc Af Amer: 49 mL/min — ABNORMAL LOW (ref >90)

## 2012-02-15 LAB — PREPARE RBC (CROSSMATCH)

## 2012-02-15 MED ORDER — LEVOFLOXACIN 750 MG PO TABS
750.0000 mg | ORAL_TABLET | ORAL | Status: DC
  Administered 2012-02-15: 750 mg via ORAL
  Filled 2012-02-15: qty 1

## 2012-02-15 MED ORDER — SODIUM CHLORIDE 0.9 % IV BOLUS (SEPSIS)
250.0000 mL | Freq: Once | INTRAVENOUS | Status: AC
  Administered 2012-02-15: 250 mL via INTRAVENOUS

## 2012-02-15 MED ORDER — FUROSEMIDE 10 MG/ML IJ SOLN
10.0000 mg | INTRAMUSCULAR | Status: AC
  Administered 2012-02-15 (×2): 10 mg via INTRAVENOUS
  Filled 2012-02-15 (×2): qty 1

## 2012-02-15 MED ORDER — ENSURE COMPLETE PO LIQD
237.0000 mL | ORAL | Status: DC
  Administered 2012-02-17 – 2012-02-22 (×5): 237 mL via ORAL

## 2012-02-15 NOTE — Progress Notes (Signed)
Clinical Social Work Department CLINICAL SOCIAL WORK PLACEMENT NOTE 02/15/2012  Patient:  Lynn Evans  Account Number:  [id number] Admit date:  02/13/2012  Clinical Social Worker:  Cori Razor, LCSW  Date/time:  02/15/2012 02:21 PM  Clinical Social Work is seeking post-discharge placement for this patient at the following level of care:   SKILLED NURSING   (*CSW will update this form in Epic as items are completed)   02/15/2012  Patient/family provided with [facility] Department of Clinical Social Work's list of facilities offering this level of care within the geographic area requested by the patient (or if unable, by the patient's family).  02/15/2012  Patient/family informed of their freedom to choose among providers that offer the needed level of care, that participate in Medicare, Medicaid or managed care program needed by the patient, have an available bed and are willing to accept the patient.  02/15/2012  Patient/family informed of [facility]' ownership interest in [facility], as well as of the fact that they are under no obligation to receive care at this facility.  PASARR submitted to EDS on  PASARR number received from EDS on   FL2 transmitted to all facilities in geographic area requested by pt/family on  02/15/2012 FL2 transmitted to all facilities within larger geographic area on   Patient informed that his/her managed care company has contracts with or will negotiate with  certain facilities, including the following:     Patient/family informed of bed offers received:   Patient chooses bed at  Physician recommends and patient chooses bed at    Patient to be transferred to  on   Patient to be transferred to facility by   The following physician request were entered in Epic:   Additional Comments:  Cori Razor LCSW [phone]

## 2012-02-15 NOTE — Progress Notes (Signed)
[facility]  [facility]                                                                                Patient Demographics  Lynn Evans, is a 78 y.o. female  OZH:[id number]  GEX:[id number]  DOB - Mar 12, 1934  Admit date - 02/13/2012  Admitting Physician Leroy Sea, MD  Outpatient Primary MD for the patient is Lynn B., MD  LOS - 2   Chief Complaint  Patient presents with  . Fall  . Hip Pain        Subjective:   [name] today has, No headache, No chest pain, No abdominal pain - No Nausea, No new weakness tingling or numbness, No Cough - SOB.  Is complaining of constant left-sided hip pain.  Objective:   Filed Vitals:   02/15/12 1006 02/15/12 1028 02/15/12 1030 02/15/12 1110  BP:  123/67 119/66 111/52  Pulse:  93 91 91  Temp:  97.6 F (36.4 C) 98 F (36.7 C) 98 F (36.7 C)  TempSrc:  Oral Oral Oral  Resp:  18 18 18   Height:      Weight:      SpO2: 92% 88%      Wt Readings from Last 3 Encounters:  02/15/12 50 kg (110 lb 3.7 oz)  02/15/12 50 kg (110 lb 3.7 oz)  12/30/11 45.088 kg (99 lb 6.4 oz)     Intake/Output Summary (Last 24 hours) at 02/15/12 1141 Last data filed at 02/15/12 1030  Gross per 24 hour  Intake 1440.67 ml  Output    770 ml  Net 670.67 ml    Exam Awake Alert, Oriented *3, No new F.N deficits, Normal affect [place].AT,PERRAL Supple Neck,No JVD, No cervical lymphadenopathy appriciated.  Symmetrical Chest wall movement, Good air movement bilaterally, CTAB RRR,No Gallops,Rubs or new Murmurs, No Parasternal Heave +ve EvansSounds, Abd Soft, Non tender, No organomegaly appriciated, No rebound -guarding or rigidity. No Cyanosis, Clubbing or edema, No new Rash or bruise    Data Review  CBC  Lab 02/15/12 0413 02/14/12 0514 02/13/12 2155 02/13/12 1655  WBC 9.1 13.3* 15.1* 26.2*  HGB 7.2* 10.3* 9.6* 10.5*  HCT 22.0* 31.9* 29.4* 31.8*  PLT 178 257 238 327  MCV 88.4 89.1 88.6 87.8  MCH 28.9 28.8 28.9 29.0  MCHC 32.7  32.3 32.7 33.0  RDW 17.0* 16.8* 16.6* 16.8*  LYMPHSABS -- -- 0.9 2.9  MONOABS -- -- 0.8 1.6*  EOSABS -- -- 0.0 0.0  BASOSABS -- -- 0.0 0.0  BANDABS -- -- -- --    Chemistries   Lab 02/15/12 0413 02/14/12 0514 02/13/12 2155 02/13/12 1655  NA 132* 134* 133* 134*  K 3.9 4.1 4.2 4.3  CL 96 95* 95* 96  CO2 30 31 31 28   GLUCOSE 95 103* 206* 136*  BUN 19 17 18 19   CREATININE 1.22* 1.14* 1.19* 1.23*  CALCIUM 8.0* 8.8 8.6 9.3  MG -- -- -- --  AST -- -- 22 26  ALT -- -- 16 17  ALKPHOS -- -- 45 52  BILITOT -- -- 0.4 0.3   ------------------------------------------------------------------------------------------------------------------ estimated creatinine clearance is 28.2 ml/min (by C-G formula based on Cr of  1.22). ------------------------------------------------------------------------------------------------------------------ No results found for this basename: HGBA1C:2 in the last 72 hours ------------------------------------------------------------------------------------------------------------------ No results found for this basename: CHOL:2,HDL:2,LDLCALC:2,TRIG:2,CHOLHDL:2,LDLDIRECT:2 in the last 72 hours ------------------------------------------------------------------------------------------------------------------ No results found for this basename: TSH,T4TOTAL,FREET3,T3FREE,THYROIDAB in the last 72 hours ------------------------------------------------------------------------------------------------------------------ No results found for this basename: VITAMINB12:2,FOLATE:2,FERRITIN:2,TIBC:2,IRON:2,RETICCTPCT:2 in the last 72 hours  Coagulation profile  Lab 02/13/12 2155 02/13/12 1655  INR 1.04 0.91  PROTIME -- --    No results found for this basename: DDIMER:2 in the last 72 hours  Cardiac Enzymes No results found for this basename: CK:3,CKMB:3,TROPONINI:3,MYOGLOBIN:3 in the last 168  hours ------------------------------------------------------------------------------------------------------------------ No components found with this basename: POCBNP:3  Micro Results Recent Results (from the past 240 hour(s))  URINE CULTURE     Status: Normal   Collection Time   02/14/12  6:33 AM      Component Value Range Status Comment   Specimen Description URINE, CATHETERIZED   Final    Special Requests NONE   Final    Culture  Setup Time [id number]   Final    Colony Count NO GROWTH   Final    Culture NO GROWTH   Final    Report Status 02/15/2012 FINAL   Final   SURGICAL PCR SCREEN     Status: Normal   Collection Time   02/14/12  7:11 AM      Component Value Range Status Comment   MRSA, PCR NEGATIVE  NEGATIVE Final    Staphylococcus aureus NEGATIVE  NEGATIVE Final     Radiology Reports Dg Chest 1 View  02/13/2012  *RADIOLOGY REPORT*  Clinical Data: Left femoral neck fracture.  Preoperative respiratory evaluation.  Former smoker with history of COPD and CHF.  CHEST - 1 VIEW  Comparison: None.  Findings: Cardiac silhouette enlarged.  Thoracic aorta atherosclerotic.  Hilar and mediastinal contours otherwise unremarkable.  Emphysematous changes throughout both lungs. Diffuse interstitial opacities which may represent chronic fibrosis, particularly at the right lung base.  No confluent airspace consolidation.  No visible pleural effusions.  IMPRESSION: Severe COPD/emphysema.  Cardiomegaly without pulmonary edema. Diffuse interstitial opacities which may be chronic and reflect interstitial fibrosis, particularly at the right lung base.  Original Report Authenticated By: Arnell Sieving, M.D.   Dg Hip 1 View Left  02/14/2012  *RADIOLOGY REPORT*  Clinical Data: Postop  LEFT HIP - 1 VIEW:  Comparison: Yesterday  Findings: Left hip hemiarthroplasty is in place.  Anatomic alignment of the osseous and prosthetic structures.  No breakage or loosening of the hardware.  IMPRESSION: Left hip  hemiarthroplasty anatomically aligned.  Original Report Authenticated By: Donavan Burnet, M.D.   Dg Hip Complete Left  02/13/2012  *RADIOLOGY REPORT*  Clinical Data: Fall.  Left hip injury with pain.  LEFT HIP - COMPLETE 2+ VIEW  Comparison: None.  Findings: Comminuted subcapital or basicervical left femoral neck fracture.  Mild inferomedial joint space narrowing.  Osteopenia. No other visible fractures.  Included AP pelvis demonstrates symmetric mild joint space narrowing in the right hip.  No fractures elsewhere involving the bony pelvis.  Sacroiliac joints intact with degenerative changes. Symphysis pubis intact.  Iliofemoral atherosclerosis.  IMPRESSION: Comminuted subcapital or basicervical left femoral neck fracture.  Original Report Authenticated By: Arnell Sieving, M.D.   Dg Pelvis Portable  02/14/2012  *RADIOLOGY REPORT*  Clinical Data: Postop  PORTABLE PELVIS  Comparison: Yesterday  Findings: Left hip hemiarthroplasty is in place.  There is anatomic alignment of the osseous and prosthetic structures.  No breakage or loosening of the hardware.  IMPRESSION: Left hip hemiarthroplasty anatomically aligned.  Original Report Authenticated By: Donavan Burnet, M.D.    Scheduled Meds:    . amiodarone  200 mg Oral Daily  . budesonide-formoterol  2 puff Inhalation BID  . diltiazem  180 mg Oral Daily  . docusate sodium  100 mg Oral BID  . enoxaparin  40 mg Subcutaneous Q24H  . furosemide  10 mg Intravenous Q4H  . furosemide  40 mg Oral Daily  . levofloxacin  750 mg Oral Q48H  . potassium chloride SA  20 mEq Oral Daily  . sodium chloride  250 mL Intravenous Once  . tiotropium  18 mcg Inhalation Daily  . DISCONTD: chlorhexidine  60 mL Topical Once  . DISCONTD: docusate sodium  100 mg Oral BID  . DISCONTD: piperacillin-tazobactam (ZOSYN)  IV  3.375 g Intravenous Q8H  . DISCONTD: vancomycin  1,000 mg Intravenous Q24H   Continuous Infusions:    . DISCONTD: lactated ringers 20 mL/hr  (02/14/12 1717)   PRN Meds:.acetaminophen, acetaminophen, albuterol, HYDROcodone-acetaminophen, ipratropium, levalbuterol, menthol-cetylpyridinium, methocarbamol (ROBAXIN) IV, methocarbamol, metoCLOPramide (REGLAN) injection, metoCLOPramide, morphine injection, ondansetron (ZOFRAN) IV, ondansetron, phenol, DISCONTD: HYDROcodone-acetaminophen, DISCONTD:  HYDROmorphone (DILAUDID) injection, DISCONTD:  morphine injection  Assessment & Plan    1. Mechanical fall causing Comminuted subcapital or basicervical left femoral neck fracture status post Left hip hemiarthroplasty #5 press fit stem, +0 neck, 44 uni head by Dr. supple on 02/14/2012 - patient will receive PT OT, wound care per orthopedics, patient has persistently refused any chemical prophylaxis as she says even 81 mg of aspirin makes her nose bleed, she was counseled about the risks of DVT PE death and disability however she continues to refuse any chemical DVT prophylaxis. We'll provide her with SCDs,  stop her IV fluids as she is eating appropriately, incentive spirometry every hour while awake.  Pain control for now for hip pain, await orthopedics to see her postop today.     2. History of atrial fibrillation patient not on Coumadin at home. Plan will be rate control home dose Cardizem and amiodarone will be continued     3. Leukocytosis with chest x-ray changes of acute versus chronic interstitial changes- patient was on empiric Vanco and Zosyn since admission, white count is improving,  Clinically appears to be much improved, we'll switch her to by mouth Levaquin on 02/15/2012 monitor clinically. Will repeat portable chest x-ray in the morning.    4. COPD and hypertension stable continue home medications no acute issues.     5. Anemia of chronic disease with acute decline. with perioperative blood loss -  he she will receive 2 unit of packed RBC transfusion with 10 mg of IV Lasix after each unit. Posttransfusion H&H will be  obtained.     6. Mild hyponatremia-check serum osmolality, urine sodium and urine osmolality, stop all IV fluids, place on fluid restriction as I think clinically this is SIADH from postop hip pain, patient appears euvolemic, the BMP in the morning.   DVT Prophylaxis -  SCDs - patient has persistently refused any chemical prophylaxis as she says even 81 mg of aspirin makes her nose bleed, she was counseled about the risks of DVT PE death and disability however she continues to refuse any chemical DVT prophylaxis. Lovenox has been ordered if patient changes mind she will get it.     Procedures - Left hip hemiarthroplasty #5 press fit stem, +0 neck, 44 uni head by Dr. supple on 02/14/2012   Consults Ortho-S work  Leroy Sea M.D on 02/15/2012 at 11:41 AM  Between 7am to 7pm - Pager - [phone]  After 7pm go to [email or web address] - password [email or web address]  And look for the night coverage person covering for me after hours  [facility] Office  [phone]

## 2012-02-15 NOTE — Progress Notes (Signed)
This AM's Hgb of 7.2 called to T Calahan NP; no orders received at present time.  Also made him aware of pt's borderline low urine output in foley throughout the night.  BP 142/70.  LR presently infusing at [facility].  Awake, alert.  No s/s acute distress.  Order for NS bolus and to keep foley catheter in for close output monitoring.

## 2012-02-15 NOTE — Progress Notes (Signed)
Nutrition follow-up  Diet advanced to heart healthy with fair to good intake.    Pt reports UBW of 100-105#.  Wt without change recently.  Pt follows low salt, caffeine free diet and is able to verbalize details of diet in detail.  Discussed need for increased good nutrition for healing.  Pt agreeable to Ensure Complete once daily.   Will continue to monitor prn for needs.  Preferences taken for dinner tonight.  Jeoffrey Massed [phone]

## 2012-02-15 NOTE — Op Note (Signed)
NAMEJUDE, LINCK NO.:  [id number]  MEDICAL RECORD NO.:  [id number]  LOCATION:  1437                         FACILITY:  [facility]  PHYSICIAN:  Vania Rea. Alayzia Pavlock, M.D.  DATE OF BIRTH:  07/20/1934  DATE OF PROCEDURE:  02/14/2012 DATE OF DISCHARGE:                              OPERATIVE REPORT   PREOPERATIVE DIAGNOSIS:  Displaced left femoral neck fracture.  POSTOPERATIVE DIAGNOSIS:  Displaced left femoral neck fracture.  PROCEDURE:  Left hip hemiarthroplasty utilizing a Press-Fit size 5 DePuy stem +0 neck length and a 44 unipolar head.  SURGEON:  Vania Rea. Verneta Hamidi, MD  ASSISTANT:  Lucita Lora. Shuford, PA-C  ANESTHESIA:  General endotracheal.  ESTIMATED BLOOD LOSS:  100 mL.  DRAINS:  None.  HISTORY:  Ms. Lynn Evans is a 78 year old female with underlying extensive medical comorbidities including COPD on chronic O2 therapy as well as CHF and hypertension who sustained a displaced left femoral neck fracture yesterday evening.  She has been admitted, medically stabilized, and is brought to the operating room at this time for planned left hip hemiarthroplasty.  Preoperatively, I counseled Ms. Teresi as well as her daughter regarding treatment options and risks versus benefits thereof.  Possible surgical complications were reviewed with potential for bleeding, infection, neurovascular injury, DVT, PE, failure of the implant, potential for dislocation of the joint, possible need for additional surgery.  She understands and accepts and agrees to planned procedure.  PROCEDURE IN DETAIL:  After undergoing routine preop evaluation, the patient received prophylactic antibiotics, and was brought to the operating room and placed supine on the operative table, underwent smooth induction of general endotracheal anesthesia.  The patient had already been started on empiric antibiotics for an elevated white count. We also used a dose of cefazolin at the beginning of the case.   The patient was turned to the right lateral decubitus position and appropriately padded and protected.  The left hip girdle region was then sterilely prepped and draped in standard fashion.  Time-out was called. A standard posterior approach to the left hip was made through a 15 cm incision centered over the greater trochanter.  Skin flaps were elevated.  Electrocautery was used for hemostasis.  Dissection carried deeply through the deep fascia and IT band which was split longitudinally along the course of the incision and the gluteus maximus was split bluntly and proximally.  Self-retaining retractors were placed.  The gluteus medius was identified and retracted anteriorly as was gluteus maximus.  Piriformis was tenotomized intact posteriorly as were the short external rotators.  Capsule was then T'd and divided with the tips of the leaflets tagged with #1 Ethibond.  This allowed access to the fracture site and the femoral head was then removed and measured at 44.  The 44 trial was in the acetabulum and showed excellent fit.  We did remove residual remnants of the ligament of teres.  The proximal femur was then exposed through the incision and soft tissue.  The piriformis fossa was then removed.  We then gained access to the femoral medullary canal with a starter reamer, canal finder, lateralizing reamer, and then began broaching ultimately up to size 5, maintaining the native anteversion.  We then used a calcar planer to smooth the calcar.  We then performed a trial reduction, which showed good soft tissue balance.  The trial broach was then removed.  We then implanted the final size 5 stem, which showed excellent fit and stability.  We performed repeat reduction and a +0 neck length showed good soft tissue balancing and equal leg lengths.  Final +0 neck length on the 44 head was then impacted over the [facility] taper after it had been cleaned and dried and final reduction of the hip was  then performed.  Overall stability was much to our satisfaction and again clinically equal leg lengths.  The wound was irrigated.  Hemostasis was obtained.  We repaired the capsule with #1 Ethibond that had been used with the tag sutures.  The #1 Ethibond was then used to repair the piriformis and the short external rotators back to the greater trochanter.  Wound was irrigated once again.  The deep fascial layer was closed with a series of interrupted figure-of-eight #1 Vicryl sutures, 2-0 Vicryl was used for the subcu layer, and intracuticular 3-0 Monocryl for the skin followed by Steri-Strips.  Dry dressing was applied.  The patient was placed supine, placed in knee immobilizer, awakened, extubated, and taken to the recovery room in stable condition.     Vania Rea. Kora Groom, M.D.     KMS/MEDQ  D:  02/14/2012  T:  02/14/2012  Job:  [id number]

## 2012-02-15 NOTE — Progress Notes (Signed)
Lynn Evans  MRN: [id number] DOB/Age: 1934/06/17 78 y.o. Physician: Lynnea Maizes, M.D. 1 Day Post-Op Procedure(s) (LRB): ARTHROPLASTY BIPOLAR HIP (Left)  Subjective: C/o left hip pain, no N/V Vital Signs Temp:  [97.2 F (36.2 C)-98.8 F (37.1 C)] 98.1 F (36.7 C) (06/24 1210) Pulse Rate:  [68-93] 93  (06/24 1210) Resp:  [16-20] 18  (06/24 1210) BP: (111-148)/(52-70) 148/65 mmHg (06/24 1210) SpO2:  [88 %-99 %] 91 % (06/24 1210) Weight:  [50 kg (110 lb 3.7 oz)] 50 kg (110 lb 3.7 oz) (06/24 0600)  Lab Results  Basename 02/15/12 0413 02/14/12 0514  WBC 9.1 13.3*  HGB 7.2* 10.3*  HCT 22.0* 31.9*  PLT 178 257   BMET  Basename 02/15/12 0413 02/14/12 0514  NA 132* 134*  K 3.9 4.1  CL 96 95*  CO2 30 31  GLUCOSE 95 103*  BUN 19 17  CREATININE 1.22* 1.14*  CALCIUM 8.0* 8.8   INR  Date Value Range Status  02/13/2012 1.04  0.00 - 1.49 Final     Exam  Left thigh diffusely tender, compartments soft, N/V intact distally but poor effort. Dressings dry  Plan Mobilize with PT, WBAT, total hip precautions. Transfusion in progress SW consult for d/c planning Tykira Wachs M 02/15/2012, 1:04 PM

## 2012-02-15 NOTE — Evaluation (Signed)
Physical Therapy Evaluation Patient Details Name: Lynn Evans MRN: [id number] DOB: 1934/07/10 Today's Date: 02/15/2012 Time: 5284-1324 PT Time Calculation (min): 24 min  PT Assessment / Plan / Recommendation Clinical Impression  Pt s/p left hip hemiarthroplasty after sustaining fx from fall at home.  Pt would benefit from acute PT services in order to improve independence with bed mobility, transfers, and ambulation to prepare for d/c to SNF.  Pt eval limited 2* pt reporting increased L hip pain 9/10 with any movement.    PT Assessment  Patient needs continued PT services    Follow Up Recommendations  Skilled nursing facility    Barriers to Discharge        lEquipment Recommendations  Defer to next venue    Recommendations for Other Services     Frequency Min 6X/week    Precautions / Restrictions Precautions Precautions: Posterior Hip;Fall Restrictions LLE Weight Bearing: Weight bearing as tolerated   Pertinent Vitals/Pain 2/10 at rest L hip increased to 9/10 with any LE movement, pt premedicated, RN notified of pain.     Mobility  Bed Mobility Bed Mobility: Supine to Sit;Sit to Supine Supine to Sit: 2: Max assist;HOB elevated Sit to Supine: 3: Mod assist;HOB elevated Details for Bed Mobility Assistance: most assist for bilateral LEs, verbal cues for technique, pt sat with weight on R side leaning againt bed rail due to increased L hip pain from transfer Transfers Transfers: Not assessed (pt declined due to pain)    Exercises     PT Diagnosis: Difficulty walking;Acute pain  PT Problem List: Decreased strength;Decreased activity tolerance;Decreased balance;Decreased mobility;Decreased knowledge of precautions;Decreased knowledge of use of DME;Decreased safety awareness;Pain PT Treatment Interventions: DME instruction;Gait training;Functional mobility training;Therapeutic activities;Therapeutic exercise;Patient/family education;Neuromuscular re-education   PT  Goals Acute Rehab PT Goals PT Goal Formulation: With patient Time For Goal Achievement: 02/22/12 Potential to Achieve Goals: Good Pt will go Supine/Side to Sit: with supervision;with rail;with HOB not 0 degrees (comment degree) PT Goal: Supine/Side to Sit - Progress: Goal set today Pt will go Sit to Supine/Side: with supervision;with rail;with HOB not 0 degrees (comment degree) PT Goal: Sit to Supine/Side - Progress: Goal set today Pt will go Sit to Stand: with min assist PT Goal: Sit to Stand - Progress: Goal set today Pt will go Stand to Sit: with min assist PT Goal: Stand to Sit - Progress: Goal set today Pt will Transfer Bed to Chair/Chair to Bed: with min assist PT Transfer Goal: Bed to Chair/Chair to Bed - Progress: Goal set today Pt will Ambulate: 16 - 50 feet;with least restrictive assistive device;with min assist PT Goal: Ambulate - Progress: Goal set today  Visit Information  Last PT Received On: 02/15/12 Assistance Needed: +2    Subjective Data  Subjective: "The surgeon said people walk the first day after surgery!"  "I don't think that'll be me."   Prior Functioning  Home Living Additional Comments: Will need SNF for rehab Prior Function Level of Independence: Independent Communication Communication: No difficulties    Cognition  Overall Cognitive Status: Appears within functional limits for tasks assessed/performed Arousal/Alertness: Awake/alert Orientation Level: Appears intact for tasks assessed Behavior During Session: [facility] for tasks performed    Extremity/Trunk Assessment Right Upper Extremity Assessment RUE ROM/Strength/Tone: [facility] for tasks assessed Left Upper Extremity Assessment LUE ROM/Strength/Tone: [facility] for tasks assessed Right Lower Extremity Assessment RLE ROM/Strength/Tone: [facility] for tasks assessed Left Lower Extremity Assessment LLE ROM/Strength/Tone: Unable to fully assess;Due to pain;Due to precautions   Balance    End  of Session PT - End of  Session Activity Tolerance: Patient limited by pain Patient left: in bed;with call bell/phone within reach;with nursing in room Nurse Communication: Precautions   Murl Zogg,[name] 02/15/2012, 2:44 PM Pager: [phone]

## 2012-02-15 NOTE — Plan of Care (Signed)
Problem: Phase II Progression Outcomes Goal: Bed to chair Outcome: Not Progressing Pt in 9/10 L hip pain sitting EOB and declined transfer to chair today.

## 2012-02-15 NOTE — Progress Notes (Signed)
OT Note:  Noted pt is receiving blood.  Will check back for eval tomorrow.  [place], [place] [phone] 02/15/2012

## 2012-02-15 NOTE — Progress Notes (Signed)
Clinical Social Work Department BRIEF PSYCHOSOCIAL ASSESSMENT 02/15/2012  Patient:  [facility]     Account Number:  [id number]     Admit date:  02/13/2012  Clinical Social Worker:  Candie Chroman  Date/Time:  02/15/2012 02:10 PM  Referred by:  Physician  Date Referred:  02/14/2012 Referred for  SNF Placement   Other Referral:   Interview type:  Patient Other interview type:    PSYCHOSOCIAL DATA Living Status:  ALONE Admitted from facility:   Level of care:   Primary support name:  Ilene Qua Primary support relationship to patient:  CHILD, ADULT Degree of support available:   supportive    CURRENT CONCERNS Current Concerns  Post-Acute Placement   Other Concerns:    SOCIAL WORK ASSESSMENT / PLAN Pt is a 78 yr old female living at home prior to hospitalization. Met with pt/spoke with daughter to assist with d/c planning. PT recommends ST SNF placement upon hospital d/c. Pt/family are in agreement with d/c plan. CSW will follow to assist with d/c planning to SNF when stable.   Assessment/plan status:  Psychosocial Support/Ongoing Assessment of Needs Other assessment/ plan:   Information/referral to community resources:   SNF list provided    PATIENT'S/FAMILY'S RESPONSE TO PLAN OF CARE: Pt/family feel SNF placement is needed.    Cori Razor LCSW [phone]

## 2012-02-15 NOTE — Progress Notes (Signed)
   CARE MANAGEMENT NOTE 02/15/2012  Patient:  [facility]   Account Number:  [id number]  Date Initiated:  02/15/2012  Documentation initiated by:  Jiles Crocker  Subjective/Objective Assessment:   ADMITTED WITH HIP FRACTURE     Action/Plan:   PCP: Ignatius Specking., MD; LIVES AT HOME WITH FAMILY MEMBERS; SOC WORKER REFERRAL PLACED FOR SHORT TERM SNF PLACEMENT   Anticipated DC Date:  02/22/2012   Anticipated DC Plan:  SKILLED NURSING FACILITY  In-house referral  Clinical Social Worker                Status of service:  In process, will continue to follow Medicare Important Message given?  NA - LOS <3 / Initial given by admissions (If response is "NO", the following Medicare IM given date fields will be blank)  Per UR Regulation:  Reviewed for med. necessity/level of care/duration of stay  Comments:  02/15/2012- B Zyia Kaneko RN, BSN, MHA

## 2012-02-16 ENCOUNTER — Inpatient Hospital Stay (HOSPITAL_COMMUNITY): Payer: Medicare Other

## 2012-02-16 DIAGNOSIS — I5033 Acute on chronic diastolic (congestive) heart failure: Secondary | ICD-10-CM

## 2012-02-16 DIAGNOSIS — D7289 Other specified disorders of white blood cells: Secondary | ICD-10-CM

## 2012-02-16 DIAGNOSIS — J9601 Acute respiratory failure with hypoxia: Secondary | ICD-10-CM | POA: Diagnosis not present

## 2012-02-16 DIAGNOSIS — R000 Tachycardia, unspecified: Secondary | ICD-10-CM

## 2012-02-16 DIAGNOSIS — S72009A Fracture of unspecified part of neck of unspecified femur, initial encounter for closed fracture: Secondary | ICD-10-CM

## 2012-02-16 DIAGNOSIS — I509 Heart failure, unspecified: Secondary | ICD-10-CM

## 2012-02-16 DIAGNOSIS — I2789 Other specified pulmonary heart diseases: Secondary | ICD-10-CM

## 2012-02-16 DIAGNOSIS — I5032 Chronic diastolic (congestive) heart failure: Secondary | ICD-10-CM

## 2012-02-16 LAB — BASIC METABOLIC PANEL
BUN: 22 mg/dL (ref 6–23)
CO2: 29 mEq/L (ref 19–32)
Calcium: 8 mg/dL — ABNORMAL LOW (ref 8.4–10.5)
Chloride: 93 mEq/L — ABNORMAL LOW (ref 96–112)
Creatinine, Ser: 1.14 mg/dL — ABNORMAL HIGH (ref 0.50–1.10)
GFR calc Af Amer: 53 mL/min — ABNORMAL LOW (ref >90)

## 2012-02-16 LAB — CBC
HCT: 28.9 % — ABNORMAL LOW (ref 36.0–46.0)
MCH: 28.9 pg (ref 26.0–34.0)
MCV: 85.3 fL (ref 78.0–100.0)
Platelets: 188 10*3/uL (ref 150–400)
RDW: 17.1 % — ABNORMAL HIGH (ref 11.5–15.5)
WBC: 13 10*3/uL — ABNORMAL HIGH (ref 4.0–10.5)

## 2012-02-16 LAB — TYPE AND SCREEN: Unit division: 0

## 2012-02-16 MED ORDER — POTASSIUM CHLORIDE IN NACL 20-0.9 MEQ/L-% IV SOLN
INTRAVENOUS | Status: AC
  Administered 2012-02-16: 35 mL/h via INTRAVENOUS
  Filled 2012-02-16: qty 1000

## 2012-02-16 MED ORDER — DEXTROSE-NACL 5-0.45 % IV SOLN: INTRAVENOUS | Status: DC

## 2012-02-16 MED ORDER — BISACODYL 10 MG RE SUPP: 10.0000 mg | Freq: Once | RECTAL | Status: DC

## 2012-02-16 MED ORDER — SODIUM CHLORIDE 0.9 % IV SOLN
1.0000 g | Freq: Two times a day (BID) | INTRAVENOUS | Status: DC
  Administered 2012-02-16 – 2012-02-17 (×2): 1 g via INTRAVENOUS
  Filled 2012-02-16 (×3): qty 1

## 2012-02-16 MED ORDER — METOCLOPRAMIDE HCL 5 MG/ML IJ SOLN: 5.0000 mg | Freq: Three times a day (TID) | INTRAMUSCULAR | Status: DC | PRN

## 2012-02-16 MED ORDER — DOCUSATE SODIUM 100 MG PO CAPS
200.0000 mg | ORAL_CAPSULE | Freq: Every day | ORAL | Status: AC
  Administered 2012-02-17 – 2012-02-18 (×2): 200 mg via ORAL
  Filled 2012-02-16 (×3): qty 2

## 2012-02-16 MED ORDER — VANCOMYCIN HCL 500 MG IV SOLR
500.0000 mg | Freq: Two times a day (BID) | INTRAVENOUS | Status: DC
  Administered 2012-02-16 – 2012-02-17 (×2): 500 mg via INTRAVENOUS
  Filled 2012-02-16 (×3): qty 500

## 2012-02-16 MED ORDER — FUROSEMIDE 40 MG PO TABS
40.0000 mg | ORAL_TABLET | Freq: Every day | ORAL | Status: DC
  Administered 2012-02-16 – 2012-02-22 (×7): 40 mg via ORAL
  Filled 2012-02-16 (×6): qty 1

## 2012-02-16 MED ORDER — LEVOFLOXACIN IN D5W 750 MG/150ML IV SOLN
750.0000 mg | INTRAVENOUS | Status: DC
  Administered 2012-02-17 – 2012-02-19 (×2): 750 mg via INTRAVENOUS
  Filled 2012-02-16 (×3): qty 150

## 2012-02-16 MED ORDER — FOOD THICKENER (THICKENUP CLEAR)
ORAL | Status: DC | PRN
  Filled 2012-02-16: qty 120

## 2012-02-16 MED ORDER — POTASSIUM CHLORIDE CRYS ER 20 MEQ PO TBCR: 40.0000 meq | EXTENDED_RELEASE_TABLET | Freq: Every day | ORAL | Status: DC

## 2012-02-16 MED ORDER — METRONIDAZOLE 500 MG PO TABS
500.0000 mg | ORAL_TABLET | Freq: Three times a day (TID) | ORAL | Status: DC
  Filled 2012-02-16 (×3): qty 1

## 2012-02-16 MED ORDER — ALBUTEROL SULFATE (5 MG/ML) 0.5% IN NEBU
2.5000 mg | INHALATION_SOLUTION | Freq: Four times a day (QID) | RESPIRATORY_TRACT | Status: DC
  Administered 2012-02-16 – 2012-02-18 (×5): 2.5 mg via RESPIRATORY_TRACT
  Filled 2012-02-16 (×4): qty 0.5

## 2012-02-16 MED ORDER — FUROSEMIDE 10 MG/ML IJ SOLN
40.0000 mg | Freq: Once | INTRAMUSCULAR | Status: AC
  Administered 2012-02-16: 40 mg via INTRAVENOUS
  Filled 2012-02-16: qty 4

## 2012-02-16 NOTE — Progress Notes (Addendum)
[facility]  [facility]                                                                                Patient Demographics  Lynn Evans, is a 78 y.o. female  WUJ:[id number]  [id number]:[id number]  DOB - 30-Sep-1933  Admit date - 02/13/2012  Admitting Physician Leroy Sea, MD  Outpatient Primary MD for the patient is VYAS,DHRUV B., MD  LOS - 3   Chief Complaint  Patient presents with  . Fall  . Hip Pain        Subjective:   [name] today has, No headache, No chest pain, No abdominal pain feels distended and constipated- No Nausea, No new weakness tingling or numbness, Mild Cough - SOB.  Is complaining of some left-sided hip pain.  Objective:   Filed Vitals:   02/16/12 0548 02/16/12 0836 02/16/12 0935 02/16/12 0959  BP: 152/74 148/63  158/56  Pulse: 90 80    Temp: 99.6 F (37.6 C) 98.4 F (36.9 C)    TempSrc: Axillary Oral    Resp: 20 18    Height:      Weight: 54.2 kg (119 lb 7.8 oz)     SpO2: 88% 91% 92%     Wt Readings from Last 3 Encounters:  02/16/12 54.2 kg (119 lb 7.8 oz)  02/16/12 54.2 kg (119 lb 7.8 oz)  12/30/11 45.088 kg (99 lb 6.4 oz)     Intake/Output Summary (Last 24 hours) at 02/16/12 1140 Last data filed at 02/16/12 0840  Gross per 24 hour  Intake   1410 ml  Output   1825 ml  Net   -415 ml    Exam Awake Alert, Oriented *3, No new F.N deficits, Normal affect [place].AT,PERRAL Supple Neck,No JVD, No cervical lymphadenopathy appriciated.  Symmetrical Chest wall movement, Good air movement bilaterally, right basilar rales RRR,No Gallops,Rubs or new Murmurs, No Parasternal Heave +ve B.Sounds, Abd Soft, Non tender, No organomegaly appriciated, No rebound -guarding or rigidity. No Cyanosis, Clubbing or edema, No new Rash or bruise      Data Review  CBC  Lab 02/16/12 0420 02/15/12 2049 02/15/12 0413 02/14/12 0514 02/13/12 2155 02/13/12 1655  WBC 13.0* 10.7* 9.1 13.3* 15.1* --  HGB 9.8* 10.2* 7.2* 10.3* 9.6* --  HCT 28.9*  29.7* 22.0* 31.9* 29.4* --  PLT 188 168 178 257 238 --  MCV 85.3 85.6 88.4 89.1 88.6 --  MCH 28.9 29.4 28.9 28.8 28.9 --  MCHC 33.9 34.3 32.7 32.3 32.7 --  RDW 17.1* 17.0* 17.0* 16.8* 16.6* --  LYMPHSABS -- -- -- -- 0.9 2.9  MONOABS -- -- -- -- 0.8 1.6*  EOSABS -- -- -- -- 0.0 0.0  BASOSABS -- -- -- -- 0.0 0.0  BANDABS -- -- -- -- -- --    Chemistries   Lab 02/16/12 0420 02/15/12 0413 02/14/12 0514 02/13/12 2155 02/13/12 1655  NA 132* 132* 134* 133* 134*  K 3.3* 3.9 4.1 4.2 4.3  CL 93* 96 95* 95* 96  CO2 29 30 31 31 28   GLUCOSE 107* 95 103* 206* 136*  BUN 22 19 17 18 19   CREATININE 1.14* 1.22* 1.14* 1.19* 1.23*  CALCIUM 8.0* 8.0* 8.8 8.6 9.3  MG -- -- -- -- --  AST -- -- -- 22 26  ALT -- -- -- 16 17  ALKPHOS -- -- -- 45 52  BILITOT -- -- -- 0.4 0.3   ------------------------------------------------------------------------------------------------------------------ estimated creatinine clearance is 30.2 ml/min (by C-G formula based on Cr of 1.14). ------------------------------------------------------------------------------------------------------------------ No results found for this basename: HGBA1C:2 in the last 72 hours ------------------------------------------------------------------------------------------------------------------ No results found for this basename: CHOL:2,HDL:2,LDLCALC:2,TRIG:2,CHOLHDL:2,LDLDIRECT:2 in the last 72 hours ------------------------------------------------------------------------------------------------------------------ No results found for this basename: TSH,T4TOTAL,FREET3,T3FREE,THYROIDAB in the last 72 hours ------------------------------------------------------------------------------------------------------------------ No results found for this basename: VITAMINB12:2,FOLATE:2,FERRITIN:2,TIBC:2,IRON:2,RETICCTPCT:2 in the last 72 hours  Coagulation profile  Lab 02/13/12 2155 02/13/12 1655  INR 1.04 0.91  PROTIME -- --    No  results found for this basename: DDIMER:2 in the last 72 hours  Cardiac Enzymes No results found for this basename: CK:3,CKMB:3,TROPONINI:3,MYOGLOBIN:3 in the last 168 hours ------------------------------------------------------------------------------------------------------------------ No components found with this basename: POCBNP:3  Micro Results Recent Results (from the past 240 hour(s))  URINE CULTURE     Status: Normal   Collection Time   02/14/12  6:33 AM      Component Value Range Status Comment   Specimen Description URINE, CATHETERIZED   Final    Special Requests NONE   Final    Culture  Setup Time [id number]   Final    Colony Count NO GROWTH   Final    Culture NO GROWTH   Final    Report Status 02/15/2012 FINAL   Final   SURGICAL PCR SCREEN     Status: Normal   Collection Time   02/14/12  7:11 AM      Component Value Range Status Comment   MRSA, PCR NEGATIVE  NEGATIVE Final    Staphylococcus aureus NEGATIVE  NEGATIVE Final     Radiology Reports Dg Chest 1 View  02/13/2012  *RADIOLOGY REPORT*  Clinical Data: Left femoral neck fracture.  Preoperative respiratory evaluation.  Former smoker with history of COPD and CHF.  CHEST - 1 VIEW  Comparison: None.  Findings: Cardiac silhouette enlarged.  Thoracic aorta atherosclerotic.  Hilar and mediastinal contours otherwise unremarkable.  Emphysematous changes throughout both lungs. Diffuse interstitial opacities which may represent chronic fibrosis, particularly at the right lung base.  No confluent airspace consolidation.  No visible pleural effusions.  IMPRESSION: Severe COPD/emphysema.  Cardiomegaly without pulmonary edema. Diffuse interstitial opacities which may be chronic and reflect interstitial fibrosis, particularly at the right lung base.  Original Report Authenticated By: Arnell Sieving, M.D.   Dg Hip 1 View Left  02/14/2012  *RADIOLOGY REPORT*  Clinical Data: Postop  LEFT HIP - 1 VIEW:  Comparison: Yesterday   Findings: Left hip hemiarthroplasty is in place.  Anatomic alignment of the osseous and prosthetic structures.  No breakage or loosening of the hardware.  IMPRESSION: Left hip hemiarthroplasty anatomically aligned.  Original Report Authenticated By: Donavan Burnet, M.D.   Dg Hip Complete Left  02/13/2012  *RADIOLOGY REPORT*  Clinical Data: Fall.  Left hip injury with pain.  LEFT HIP - COMPLETE 2+ VIEW  Comparison: None.  Findings: Comminuted subcapital or basicervical left femoral neck fracture.  Mild inferomedial joint space narrowing.  Osteopenia. No other visible fractures.  Included AP pelvis demonstrates symmetric mild joint space narrowing in the right hip.  No fractures elsewhere involving the bony pelvis.  Sacroiliac joints intact with degenerative changes. Symphysis pubis intact.  Iliofemoral atherosclerosis.  IMPRESSION: Comminuted subcapital or basicervical left femoral neck fracture.  Original Report Authenticated By: Arnell Sieving, M.D.   Dg Pelvis Portable  02/14/2012  *RADIOLOGY REPORT*  Clinical Data: Postop  PORTABLE PELVIS  Comparison: Yesterday  Findings: Left hip hemiarthroplasty is in place.  There is anatomic alignment of the osseous and prosthetic structures.  No breakage or loosening of the hardware.  IMPRESSION: Left hip hemiarthroplasty anatomically aligned.  Original Report Authenticated By: Donavan Burnet, M.D.    Scheduled Meds:    . amiodarone  200 mg Oral Daily  . bisacodyl  10 mg Rectal Once  . budesonide-formoterol  2 puff Inhalation BID  . diltiazem  180 mg Oral Daily  . docusate sodium  200 mg Oral Daily  . enoxaparin  40 mg Subcutaneous Q24H  . feeding supplement  237 mL Oral Q24H  . furosemide  10 mg Intravenous Q4H  . furosemide  40 mg Oral Daily  . levofloxacin  750 mg Oral Q48H  . metroNIDAZOLE  500 mg Oral Q8H  . potassium chloride SA  20 mEq Oral Daily  . tiotropium  18 mcg Inhalation Daily  . DISCONTD: docusate sodium  100 mg Oral BID  .  DISCONTD: furosemide  40 mg Oral Daily  . DISCONTD: piperacillin-tazobactam (ZOSYN)  IV  3.375 g Intravenous Q8H  . DISCONTD: vancomycin  1,000 mg Intravenous Q24H   Continuous Infusions:   PRN Meds:.acetaminophen, acetaminophen, albuterol, HYDROcodone-acetaminophen, ipratropium, levalbuterol, menthol-cetylpyridinium, metoCLOPramide (REGLAN) injection, morphine injection, ondansetron (ZOFRAN) IV, DISCONTD: methocarbamol (ROBAXIN) IV, DISCONTD: methocarbamol, DISCONTD: metoCLOPramide (REGLAN) injection, DISCONTD: metoCLOPramide, DISCONTD: ondansetron, DISCONTD: phenol  Assessment & Plan    78 year old Caucasian female who was admitted with mechanical fall and left femoral neck fracture, she also has history of atrial fibrillation, patient does not take any anti-coagulation and even refuses prophylactic dose Lovenox as she says she bleeds very easily, was admitted with leukocytosis and questionable right lower lobe infiltrate, during her hospital stay she has developed ileus and has large stool burden in her colon, also her right lower lobe infiltrate seems to be getting worse with increased oxygen demand. Family later informed the patient was recently admitted to [facility] intubated for acute respiratory failure due to COPD, CHF, questionable pneumonia. Detailed bedside discussion with family and patient made at 4 pm she is now DO NOT RESUSCITATE DO NOT INTUBATE medical treatment okay to continue.     1. Mechanical fall causing Comminuted subcapital or basicervical left femoral neck fracture status post Left hip hemiarthroplasty #5 press fit stem, +0 neck, 44 uni head by Dr. supple on 02/14/2012 - patient will receive PT OT, wound care per orthopedics, patient has persistently refused any chemical prophylaxis as she says even 81 mg of aspirin makes her nose bleed, she was counseled about the risks of DVT PE death and disability however she continues to refuse any chemical DVT prophylaxis. We'll  provide her with SCDs, Pain control for now for hip pain, Ortho following Dr Rennis Chris.    2. History of atrial fibrillation patient not on Coumadin at home. Plan will be rate control home dose Cardizem and amiodarone will be continued     3. Leukocytosis with chest x-ray changes of acute versus chronic interstitial changes- patient received 24 hours of vancomycin and Zosyn, on 02/15/2012 since her numbers looked better and she felt good she was switched to oral Levaquin, however looks like patient could have aspirated again last night 24 hours into the hospital stay, right lower lobe infiltrate is worse, this could have been secondary to constipation and  Ileus that she developed. Have requested speech to see her, aspiration precautions, feeding assistance, escallate ABX to IV Vanco-Mero and IV Levaquin, oxygen and nebulizer treatments, closely monitor continuous pulse oximetry. He she will get them of her large stool burden to facilitate with bowel decompression.   Addendum - D/W daughters x 2 later in the day, pt apparently was intubated 1 mth ago at [facility] for COPD-CHF ? Pneumonia - ? If she aspirates chronically, IV ABX, PCCM input, D/W Family about code status, they will let us know soon.   4pm -   Family meeting done at 4 PM by the bedside patient was alert awake and oriented and expressed her wishes to be DO NOT RESUSCITATE and DO NOT INTUBATE, family in agreement, okay for IV antibiotics nebulizer treatments nonrebreather mask and other medical measures.   4.Hypertension stable -  continue home medications no acute issues.     5. Anemia of chronic disease with acute decline. with perioperative blood loss -  as post 2 units of packed RBC transfusion on 02/15/2012 followed by IV Lasix , followup H&H is stable.     6. Mild hyponatremia- urine osmolality is greater than serum osmolality with urine sodium greater than 40, adjusting SIADH secondary to hip pain and pain medications, will  stop all IV fluids, place on fluid restriction as I think clinically this is SIADH from postop hip pain, patient appears euvolemic, repeat BMP in the morning.     DVT Prophylaxis -  SCDs - patient has persistently refused any chemical prophylaxis as she says even 81 mg of aspirin makes her nose bleed, she was counseled about the risks of DVT PE death and disability however she continues to refuse any chemical DVT prophylaxis. Lovenox has been ordered if patient changes mind she will get it.     Procedures - Left hip hemiarthroplasty #5 press fit stem, +0 neck, 44 uni head by Dr. supple on 02/14/2012, soapsuds enema.   Consults - Ortho-S work, speech therapy.   Called patients daughter and updated her on home phone [phone], 2nd daughter bedside. Prognosis poor. Was tubed last mth at [facility] per family.    Family meeting done at 4 PM by the bedside patient was alert awake and oriented and expressed her wishes to be DO NOT RESUSCITATE and DO NOT INTUBATE, family in agreement, okay for IV antibiotics nebulizer treatments nonrebreather mask and other medical measures.    Leroy Sea M.D on 02/16/2012 at 11:40 AM  Between 7am to 7pm - Pager - [phone]  After 7pm go to [email or web address] - password [email or web address]  And look for the night coverage person covering for me after hours  [facility] Office  [phone]

## 2012-02-16 NOTE — Progress Notes (Signed)
np notified of patient abdomen being firm and distended. Patient c/o pain with palpations and movement. Bowel sounds heard faintly  only in the right lower quadrants.new orders received and initiated. Will continue to monitor.

## 2012-02-16 NOTE — Progress Notes (Signed)
Lynn Evans  MRN: [id number] DOB/Age: [birth year] 77 y.o. Physician: Jacquelyne Balint Procedure: Procedure(s) (LRB): ARTHROPLASTY BIPOLAR HIP (Left)     Subjective: Left hip feeling better but complains of moderate right ankle pain  Vital Signs Temp:  [97.5 F (36.4 C)-99.6 F (37.6 C)] 98.3 F (36.8 C) (06/25 1500) Pulse Rate:  [74-93] 78  (06/25 1500) Resp:  [18-22] 20  (06/25 1500) BP: (107-174)/(55-75) 148/66 mmHg (06/25 1500) SpO2:  [88 %-95 %] 91 % (06/25 1500) FiO2 (%):  [50 %] 50 % (06/25 0935) Weight:  [52.4 kg (115 lb 8.3 oz)-54.2 kg (119 lb 7.8 oz)] 54.2 kg (119 lb 7.8 oz) (06/25 0548)  Lab Results  Basename 02/16/12 0420 02/15/12 2049  WBC 13.0* 10.7*  HGB 9.8* 10.2*  HCT 28.9* 29.7*  PLT 188 168   BMET  Basename 02/16/12 0420 02/15/12 0413  NA 132* 132*  K 3.3* 3.9  CL 93* 96  CO2 29 30  GLUCOSE 107* 95  BUN 22 19  CREATININE 1.14* 1.22*  CALCIUM 8.0* 8.0*   INR  Date Value Range Status  02/13/2012 1.04  0.00 - 1.49 Final     Exam Left hip dressing with scant old bloody drainage.thigh soft. Moves toes well on Left Right ankle shows moderate tenderness to palpation diffusely about ankle. No instablility of ankle joint or obvious swelling-patient declines xray. Believe it unlikely to have fracture based on exam        Plan Continue meedical care per Hospitalist service. Continue OOB with PT/OT From our standpoint mechanical DVT prophylaxis ok Will follow  Jameya Pontiff for Dr.Kevin Supple 02/16/2012, 3:49 PM

## 2012-02-16 NOTE — Progress Notes (Signed)
Occupational Therapy Note Chart reviewed. Pt just back from a test and sound asleep. Will try at another time. Note pt also now with Venturi mask. Judithann Sauger OTR/L [phone] 02/16/2012

## 2012-02-16 NOTE — Progress Notes (Signed)
RN called NP earlier in shift 2/2 distended abd with pain. Pt is postop hip surgery. KUB ordered which shows ileus. Pt is not vomiting and not c/o pain unless moving. Bowel rest for now with NPO status. If worse, NGT. Maren Reamer, NP [facility]

## 2012-02-16 NOTE — Consult Note (Signed)
Name: Lynn Evans MRN: [id number] DOB: 15-Jul-1934    LOS: 3  [place] [facility] Critical Care Note   History of Present Illness: 78 y/o F, former smoker, with PMH of chronic diastolic CHF, COPD on home O2, Arthritis, atrial fibrillation (not on coumadin) admitted to [facility] on 6/22 s/p fall after tripping over her oxygen tubing with subsequent pain in her left hip and inability to bear weight.  Evaluation demonstrated a questionable RLL infiltrate and leukocytosis.  Hospital course complicated by ileus with large stool burden in colon.  PCCM consulted for increasing O2 needs and increase in size of RLL infiltrate.    She had recent admit to [facility] from 4/25 - 4/30 for hypercarbic respiratory failure requiring intubation, new onset atrial fibrillation, acute renal insufficiency and pulmonary HTN.      Cultures: 6/23 UC>>>neg 6/23 MRSA PCR>>>neg  Antibiotics: Levaquin 6/24>>> Meropenem 6/25>>> Zosyn 6/22>>>   Tests / Events: 6/22 - Admit s/p fall with L hip fracture 6/23 - L Hemiarthroplasty 6/25 - increasing O2 needs, increased RLL infiltrate.  Speech eval (hx of known dysphagia since intubation 4/13).  Nectar thick liquids recommended   Past Medical History  Diagnosis Date  . CHF (congestive heart failure)   . COPD (chronic obstructive pulmonary disease)   . Dysrhythmia   . Arthritis     Past Surgical History  Procedure Date  . Hemorrhoid surgery     Prior to Admission medications   Medication Sig Start Date End Date Taking? Authorizing Provider  amiodarone (PACERONE) 200 MG tablet 200mg  BID for 2 weeks then 200mg  PO QD 12/22/11  Yes Jeanella Craze, NP  budesonide-formoterol (SYMBICORT) 160-4.5 MCG/ACT inhaler Inhale 2 puffs into the lungs 2 (two) times daily.   Yes Historical Provider, MD  Calcium Carbonate Antacid (TUMS PO) Take 1 tablet by mouth as needed. For heartburn/upset stomach   Yes Historical Provider, MD  diltiazem (CARDIZEM CD) 180 MG 24 hr capsule Take 1 capsule  (180 mg total) by mouth daily. 12/22/11 12/21/12 Yes Jeanella Craze, NP  furosemide (LASIX) 40 MG tablet Take 1 tablet (40 mg total) by mouth daily. 12/22/11 12/21/12 Yes Jeanella Craze, NP  levalbuterol (XOPENEX HFA) 45 MCG/ACT inhaler Inhale 1-2 puffs into the lungs every 4 (four) hours as needed for wheezing or shortness of breath. 12/22/11 12/21/12 Yes Jeanella Craze, NP  potassium chloride SA (K-DUR,KLOR-CON) 20 MEQ tablet Take 1 tablet (20 mEq total) by mouth daily. 12/22/11 12/21/12 Yes Jeanella Craze, NP  tiotropium (SPIRIVA) 18 MCG inhalation capsule Place 18 mcg into inhaler and inhale daily.   Yes Historical Provider, MD    Allergies No Known Allergies  Family History Family History  Problem Relation Age of Onset  . Colon cancer Mother   . Cirrhosis Father     Social History  reports that she quit smoking about 3 months ago. Her smoking use included Cigarettes. She has a 57 pack-year smoking history. She has quit using smokeless tobacco. She reports that she does not drink alcohol or use illicit drugs.  Review Of Systems:  Gen: Denies fever, chills, weight change, fatigue, night sweats HEENT: Denies blurred vision, double vision, hearing loss, tinnitus, sinus congestion, rhinorrhea, sore throat, neck stiffness, dysphagia.  Has had nose bleeds on aspirin as of late.   PULM: Denies shortness of breath, hemoptysis, wheezing.  Indicates minimal cough with pink sputum production.   CV: Denies chest pain, edema, orthopnea, paroxysmal nocturnal dyspnea, palpitations GI: Denies nausea, vomiting, diarrhea, hematochezia, melena,  change in bowel habits.  Indicates abdominal pain and constipation. GU: Denies dysuria, hematuria, polyuria, oliguria, urethral discharge Endocrine: Denies hot or cold intolerance, polyuria, polyphagia or appetite change Derm: Denies rash, dry skin, scaling or peeling skin change Heme: Denies easy bruising, bleeding, bleeding gums Neuro: Denies headache, numbness,  weakness, slurred speech, loss of memory or consciousness  Vital Signs: Temp:  [97.5 F (36.4 C)-99.6 F (37.6 C)] 98.3 F (36.8 C) (06/25 1500) Pulse Rate:  [74-93] 78  (06/25 1500) Resp:  [18-22] 20  (06/25 1500) BP: (107-174)/(55-75) 148/66 mmHg (06/25 1500) SpO2:  [88 %-95 %] 91 % (06/25 1500) FiO2 (%):  [50 %] 50 % (06/25 0935) Weight:  [115 lb 8.3 oz (52.4 kg)-119 lb 7.8 oz (54.2 kg)] 119 lb 7.8 oz (54.2 kg) (06/25 0548) I/O last 3 completed shifts: In: 2430.7 [P.O.:780; I.V.:600.7; Blood:700; IV Piggyback:350] Out: 2375 [Urine:2375] O2 - 50% VM  Physical Examination: General: chronically ill elderly female in NAD Neuro: AAOx4, spirited, MAE spontaneously CV: s1s2 rrr, no m/r/g PULM: resp's even/non-labored on 50% VM, R lung with crackles, left clear GI: round /soft, decreased bsx4 Extremities: warm/dry, lower extremity edema L>R (left sided surgery)   Ventilator settings: Vent Mode:  [-]  FiO2 (%):  [50 %] 50 %  Labs    CBC  Lab 02/16/12 0420 02/15/12 2049 02/15/12 0413  HGB 9.8* 10.2* 7.2*  HCT 28.9* 29.7* 22.0*  WBC 13.0* 10.7* 9.1  PLT 188 168 178     BMET  Lab 02/16/12 0420 02/15/12 0413 02/14/12 0514 02/13/12 2155 02/13/12 1655  NA 132* 132* 134* 133* 134*  K 3.3* 3.9 -- -- --  CL 93* 96 95* 95* 96  CO2 29 30 31 31 28   GLUCOSE 107* 95 103* 206* 136*  BUN 22 19 17 18 19   CREATININE 1.14* 1.22* 1.14* 1.19* 1.23*  CALCIUM 8.0* 8.0* 8.8 8.6 9.3  MG -- -- -- -- --  PHOS -- -- -- -- --     Lab 02/13/12 2155 02/13/12 1655  INR 1.04 0.91    No results found for this basename: PHART:5,PCO2:5,PCO2ART:5,PO2:5,PO2ART:5,HCO3:5,TCO2:5,O2SAT:5 in the last 168 hours   Radiology: 6/25 CXR:  Emphysematous changes and diffuse parenchymal fibrosis. Possible superimposed developing airspace infiltration or pneumonia in the right lower lung.    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  Concern for RLL infiltrate with worsening 6/25.  Pink tinged sputum likely related to recent nose bleeds with aspiration of blood / recent intubation for surgery / and irritant cough.   On amiodarone since April but not likely source of changes noted on cxr. She has known history of dysphagia since her admit in 4/13.  ST eval concerning for current evidence of aspiration.    PULMONARY 12/19/11 ECHO>>>Pulmonary arteries: PA peak pressure: 50mm Hg (S).  EF 55-60%    RLL Airspace disease / Fibrotic changes on CXR COPD Pulmonary HTN (by ECHO) Dysphagia Afib - rate controlled  Plan: -lasix as renal fxn and BP permit -change to IV dosing, additional dose 6/24 -check pct /  bnp in am -continue spiriva, albuterol, symbicort -continue antibiotics with low threshold to narrow to flouroquinolone pending results of PCT / BNP.   -f/u cxr am of 6/27 (patient is reluctant to have many films and daily not needed) -wean O2 to keep saturations 88-94% -follow sputum culture -minimize narcotics / sedating medications as able to reduce atx   All other medical problems per TRH.      Canary Brim, NP-C [place] [facility] Pgr: [phone]  02/16/2012, 3:43 PM    Billy Fischer, MD ; [facility] [phone].  After 5:30 PM or weekends, call [phone]

## 2012-02-16 NOTE — Evaluation (Signed)
Clinical/Bedside Swallow Evaluation Patient Details  Name: Bianney Rockwood MRN: [id number] Date of Birth: 1933-11-12  Today's Date: 02/16/2012 Time: 4098-1191 SLP Time Calculation (min): 58 min  Past Medical History:  Past Medical History  Diagnosis Date  . CHF (congestive heart failure)   . COPD (chronic obstructive pulmonary disease)   . Dysrhythmia   . Arthritis    Past Surgical History:  Past Surgical History  Procedure Date  . Hemorrhoid surgery    HPI:  78 yo adm to [facility] after fall with hip fx s/p hemiarthroplasty.  Pt CXR indicated increased density at right lower lobe - ? pna or airspace infiltrate.  Pt had had incr oxygen needs, requiring up to 15 liters venturi mask currently- desating when mask removed within approx 30 seconds.  Question ileus present  per DG abdomen - stool filled colon with dilated small and large colon- early obstruction not excluded.  Pt reports last bowel movement to be Saturday.  Recent admission with diagnosis of COPD noted - pt h/o smoking - requiring intubation for a few days in April 2013.  Per family pt has been coughing some with po intake prior to admission.  She is on two liters of oxygen at home.     Assessment / Plan / Recommendation Clinical Impression  Pt with baseline dysphagia since her intubation in April 2013 characterized by occasional coughing with po intake - with solids and thin liquids via straw per family.  Today pt's respiratory compromise negativly impacts abiliity to consume po.  Upon removal of venturi mask for self dental brushing, pt desats within 30 seconds.  Overt coughing with thin liquids noted today, suspect aspiration.  Pt tolerated nectar via straw without s/s of aspiration.  Recommend continue nectar and slp to follow for dietary advancement appropriateness.  Educated family to  SLPs suspicions that pt was aspiratiing over the last few weeks - especially given her reports of cough with liquids.  Pt likely tolerated aspiration  while well., but now she is more compromised and bedridden while recovering from surgery.  Also suspect an esophageal contribution as pt belches frequently and states at home she sensed fullness in chest with meals and had to belch to clear before she could eat more.  Pt may benefit from MBS during this hospitalization to instrumentally evaluate swallow, given known recent dysphagia.  SLP to follow.   Thanks!    Aspiration Risk  Moderate    Diet Recommendation Nectar-thick liquid (liquids only due to mask-)   Medication Administration: Whole meds with liquid (nectar) Supervision: Staff feed patient (WITH MASK ON- use straws) Postural Changes and/or Swallow Maneuvers: Seated upright 90 degrees;Upright 30-60 min after meal    Other  Recommendations Recommended Consults:  (defer MBS d/t possible ileus) Oral Care Recommendations: Oral care BID   Follow Up Recommendations       Frequency and Duration min 2x/week  2 weeks   Pertinent Vitals/Pain Afebrile, decreased, congested cough    SLP Swallow Goals   1.  Pt will follow compensatory strategies to decr amount aspirated with mod verbal assist.    Swallow Study Prior Functional Status       General HPI: 78 yo adm to [facility] after fall with hip fx s/p hemiarthroplasty.  Pt CXR indicated increased density at right lower lobe - ? pna or airspace infiltrate.  Pt had had incr oxygen needs, requiring up to 15 liters venturi mask currently- desating when mask removed within approx 30 seconds.  Question ileus present  per DG abdomen - stool filled colon with dilated small and large colon- early obstruction not excluded.  Pt reports last bowel movement to be Saturday.  Recent admission with diagnosis of COPD noted - pt h/o smoking - requiring intubation for a few days in April 2013.  Per family pt has been coughing some with po intake prior to admission.  She is on two liters of oxygen at home.   Type of Study: Bedside swallow evaluation Previous  Swallow Assessment: BSE recommended soft/thin - no straws Diet Prior to this Study: NPO Temperature Spikes Noted: No Respiratory Status: Supplemental O2 delivered via (comment) (venturimask) History of Recent Intubation: Yes Date extubated: 12/17/11 Behavior/Cognition: Alert;Cooperative;Pleasant mood Oral Cavity - Dentition: Adequate natural dentition Self-Feeding Abilities: Needs assist (due to mask) Patient Positioning: Upright in bed Baseline Vocal Quality: Clear Volitional Cough: Strong (productive to tan colored viscous secretions) Volitional Swallow: Able to elicit    Oral/Motor/Sensory Function Overall Oral Motor/Sensory Function: Appears within functional limits for tasks assessed   Ice Chips Ice chips: Not tested   Thin Liquid Thin Liquid: Impaired Presentation: Straw Pharyngeal  Phase Impairments: Cough - Immediate Other Comments: family reports pt coughs immediately after swallow via straw at home    Nectar Thick Nectar Thick Liquid: Within functional limits Presentation: Straw   Honey Thick Honey Thick Liquid: Not tested   Puree Puree: Within functional limits Presentation: Spoon Other Comments: icecream - pt desats quickly with mask removal inhibiting ability to consume solids   Solid Solid: Impaired Other Comments: decreased mastication abilitites, pt complained of xerostomia - used icecream to aid oral transiting     Donavan Burnet, MS [facility] SLP [phone]

## 2012-02-16 NOTE — Progress Notes (Signed)
ANTIBIOTIC CONSULT NOTE - INITIAL  Pharmacy Consult for Vancomycin, Levaquin, Meropenem Indication: rule out pneumonia  No Known Allergies  Patient Measurements: Height: 5' (152.4 cm) Weight: 119 lb 7.8 oz (54.2 kg) IBW/kg (Calculated) : 45.5   Vital Signs: Temp: 98.4 F (36.9 C) (06/25 0836) Temp src: Oral (06/25 0836) BP: 158/56 mmHg (06/25 0959) Pulse Rate: 80  (06/25 0836)  Labs:  Basename 02/16/12 0420 02/15/12 2049 02/15/12 0413 02/14/12 0514  WBC 13.0* 10.7* 9.1 --  HGB 9.8* 10.2* 7.2* --  PLT 188 168 178 --  LABCREA -- -- -- --  CREATININE 1.14* -- 1.22* 1.14*   Estimated Creatinine Clearance: 30.2 ml/min (by C-G formula based on Cr of 1.14).   Microbiology: Recent Results (from the past 720 hour(s))  URINE CULTURE     Status: Normal   Collection Time   02/14/12  6:33 AM      Component Value Range Status Comment   Specimen Description URINE, CATHETERIZED   Final    Special Requests NONE   Final    Culture  Setup Time [id number]   Final    Colony Count NO GROWTH   Final    Culture NO GROWTH   Final    Report Status 02/15/2012 FINAL   Final   SURGICAL PCR SCREEN     Status: Normal   Collection Time   02/14/12  7:11 AM      Component Value Range Status Comment   MRSA, PCR NEGATIVE  NEGATIVE Final    Staphylococcus aureus NEGATIVE  NEGATIVE Final     Medical History: Past Medical History  Diagnosis Date  . CHF (congestive heart failure)   . COPD (chronic obstructive pulmonary disease)   . Dysrhythmia   . Arthritis     Assessment: 55 YOF admitted with L femoral neck fracture.  Patient has been on antibiotics since admission empirically for leukocytosis, narrowed to Levaquin yesterday.  Now, suspecting pneumonia so restarting broad spectrum antibiotics with vancomycin, levaquin, and meropenem.  SCr 1.14, moderately stable.  Weight 54.2 kg. CrCl~47(N), CrCl~35 (CG) ml/min.  Goal of Therapy:  Vancomycin trough level 15-20 mcg/ml Doses adjusted per  renal clearance  Plan:  Vancomycin 500 mg IV q12h. Meropenem 1g IV q12h. Levaquin 750 mg IV q48h. F/u Scr and trough levels for dose adjustments.  Clance Boll 02/16/2012,2:48 PM

## 2012-02-16 NOTE — Progress Notes (Signed)
Physical Therapy Treatment Patient Details Name: Lynn Evans MRN: [id number] DOB: 26-Mar-1934 Today's Date: 02/16/2012 Time: 4098-1191 PT Time Calculation (min): 12 min  PT Assessment / Plan / Recommendation Comments on Treatment Session  Pt assisted to EOB however SaO2 dropped to 75% on venturi mask 50% FiO2 so pt returned to supine and SaO2 increased back to 92%.    Follow Up Recommendations  Skilled nursing facility    Barriers to Discharge        Equipment Recommendations  Defer to next venue    Recommendations for Other Services    Frequency     Plan Discharge plan remains appropriate;Frequency remains appropriate    Precautions / Restrictions Precautions Precautions: Posterior Hip;Fall Restrictions LLE Weight Bearing: Weight bearing as tolerated   Pertinent Vitals/Pain 2/10 L hip pain at rest    Mobility  Bed Mobility Bed Mobility: Supine to Sit;Sit to Supine Supine to Sit: 1: +2 Total assist Supine to Sit: Patient Percentage: 10% Sit to Supine: 1: +2 Total assist Sit to Supine: Patient Percentage: 10% Details for Bed Mobility Assistance: pt assisted to sitting EOB with verbal cues, pt able to help move into diagonal position in bed but not bring LEs over or sit trunk upright without increased assist due to pain in L hip, pt on venturi mask at 50% and SaO2 decreased to 75% upon sitting then increased to 85% then dropped to 75% again so pt assist back to supine and SaO2 increased over 2-3 minutes to 92% Transfers Transfers: Not assessed    Exercises     PT Diagnosis:    PT Problem List:   PT Treatment Interventions:     PT Goals Acute Rehab PT Goals PT Goal: Supine/Side to Sit - Progress: Progressing toward goal PT Goal: Sit to Supine/Side - Progress: Progressing toward goal  Visit Information  Last PT Received On: 02/16/12 Assistance Needed: +2    Subjective Data  Subjective: "I know I need to try."  mobility   Cognition  Overall Cognitive Status:  Appears within functional limits for tasks assessed/performed    Balance     End of Session PT - End of Session Activity Tolerance: Other (comment) (limited by drop in SaO2 on venturi mask) Patient left: in bed;with call bell/phone within reach    [facility] E 02/16/2012, 12:17 PM Pager: [phone]

## 2012-02-16 NOTE — Progress Notes (Signed)
OT Note:  Pt is moving slowly with PT and desaturated on venimask.  Will check with PT and initiate OT eval when pt will benefit.  Rembert, Pershing [phone] 02/16/2012

## 2012-02-17 ENCOUNTER — Encounter (HOSPITAL_COMMUNITY): Payer: Self-pay | Admitting: Orthopedic Surgery

## 2012-02-17 ENCOUNTER — Inpatient Hospital Stay (HOSPITAL_COMMUNITY): Payer: Medicare Other

## 2012-02-17 DIAGNOSIS — S72009A Fracture of unspecified part of neck of unspecified femur, initial encounter for closed fracture: Secondary | ICD-10-CM

## 2012-02-17 DIAGNOSIS — R000 Tachycardia, unspecified: Secondary | ICD-10-CM

## 2012-02-17 DIAGNOSIS — J8410 Pulmonary fibrosis, unspecified: Secondary | ICD-10-CM

## 2012-02-17 DIAGNOSIS — I5032 Chronic diastolic (congestive) heart failure: Secondary | ICD-10-CM

## 2012-02-17 DIAGNOSIS — J9600 Acute respiratory failure, unspecified whether with hypoxia or hypercapnia: Secondary | ICD-10-CM

## 2012-02-17 DIAGNOSIS — D7289 Other specified disorders of white blood cells: Secondary | ICD-10-CM

## 2012-02-17 DIAGNOSIS — J849 Interstitial pulmonary disease, unspecified: Secondary | ICD-10-CM | POA: Diagnosis present

## 2012-02-17 DIAGNOSIS — J449 Chronic obstructive pulmonary disease, unspecified: Secondary | ICD-10-CM

## 2012-02-17 LAB — CBC
MCHC: 33.7 g/dL (ref 30.0–36.0)
Platelets: 173 10*3/uL (ref 150–400)
RDW: 16.9 % — ABNORMAL HIGH (ref 11.5–15.5)
WBC: 10.7 10*3/uL — ABNORMAL HIGH (ref 4.0–10.5)

## 2012-02-17 LAB — BASIC METABOLIC PANEL
BUN: 19 mg/dL (ref 6–23)
Calcium: 8.2 mg/dL — ABNORMAL LOW (ref 8.4–10.5)
Creatinine, Ser: 1.11 mg/dL — ABNORMAL HIGH (ref 0.50–1.10)
GFR calc non Af Amer: 47 mL/min — ABNORMAL LOW (ref >90)
Glucose, Bld: 105 mg/dL — ABNORMAL HIGH (ref 70–99)

## 2012-02-17 LAB — PRO B NATRIURETIC PEPTIDE: Pro B Natriuretic peptide (BNP): 5879 pg/mL — ABNORMAL HIGH (ref 0–450)

## 2012-02-17 MED ORDER — HYDROCODONE-ACETAMINOPHEN 5-325 MG PO TABS
1.0000 | ORAL_TABLET | Freq: Four times a day (QID) | ORAL | Status: DC | PRN
  Administered 2012-02-18 – 2012-02-22 (×7): 1 via ORAL
  Filled 2012-02-17 (×7): qty 1

## 2012-02-17 MED ORDER — FUROSEMIDE 10 MG/ML IJ SOLN
40.0000 mg | Freq: Once | INTRAMUSCULAR | Status: AC
  Administered 2012-02-17: 40 mg via INTRAVENOUS
  Filled 2012-02-17: qty 4

## 2012-02-17 MED ORDER — POTASSIUM CHLORIDE 10 MEQ/100ML IV SOLN
10.0000 meq | INTRAVENOUS | Status: AC
  Administered 2012-02-17 (×3): 10 meq via INTRAVENOUS
  Filled 2012-02-17 (×3): qty 100

## 2012-02-17 MED ORDER — METOCLOPRAMIDE HCL 5 MG/ML IJ SOLN
5.0000 mg | Freq: Three times a day (TID) | INTRAMUSCULAR | Status: DC
  Administered 2012-02-17 – 2012-02-22 (×16): 5 mg via INTRAVENOUS
  Filled 2012-02-17 (×3): qty 1
  Filled 2012-02-17: qty 2
  Filled 2012-02-17 (×7): qty 1
  Filled 2012-02-17: qty 2
  Filled 2012-02-17 (×3): qty 1
  Filled 2012-02-17: qty 2
  Filled 2012-02-17 (×2): qty 1
  Filled 2012-02-17: qty 2

## 2012-02-17 NOTE — Progress Notes (Signed)
Speech Language Pathology Dysphagia Treatment Patient Details Name: Lynn Evans MRN: [id number] DOB: Apr 08, 1934 Today's Date: 02/17/2012 Time: 8469-6295 SLP Time Calculation (min): 38 min  Assessment / Plan / Recommendation Clinical Impression  Pt's swallow ability appears consistent today with previous evaluation.  Pt again de-sats when venturimask pulled away within 30 seconds as she brushed her teeth.   Pt given Ensure mixed with icecream via straw- she took 3 straw sips- requiring moderate verbal cues to take small boluses size but no clinical s/s of aspiration. Pt also observed with two bites of graham cracker - adequate mastication with apparent tolerance.  Mask was momentarily pulled away just enough for pt to get bolus into oral cavity without significant decline in oxygen saturation.  Intake was minimal and pt quickly stated "I'm full", declining all further intake.    Daughter Lynn Evans stated she thinks pt has "given up" and doesn't want to ber a burden to her family.  Per daughter, pt has decided she did not want to be re-intubated and she is DNR.     Recommend consider advancing diet to mechanical soft/ground meats for energy conservation and continue nectar liquids.  SLP did not test thin as RN reports pt was consuming thin coffee today and overtly coughing.  After pt was educated by RN, pt was agreeable to continue nectar to decr asp risk per RN.  SLP to follow.  Thanks.       Diet Recommendation  Initiate / Change Diet: Dysphagia 3 (mechanical soft);Nectar-thick liquid    SLP Plan Continue with current plan of care   Pertinent Vitals/Pain Afebrile, lungs decreased, 50% venturi mask at 15 liters,  CXR 6/26 showed slight improvement in right more than left opacity   Swallowing Goals  SLP Swallowing Goals Patient will utilize recommended strategies during swallow to increase swallowing safety with: Minimal assistance  General Temperature Spikes Noted: No Respiratory Status:  Supplemental O2 delivered via (comment) (venturi mask at 15L) Behavior/Cognition: Cooperative;Alert (easily nods off) Oral Cavity - Dentition: Adequate natural dentition Patient Positioning: Upright in bed  Oral Cavity - Oral Hygiene     Dysphagia Treatment Treatment focused on: Skilled observation of diet tolerance;Upgraded PO texture trials;Patient/family/caregiver Dealer Educated: Youngest Daughter Lynn Evans Treatment Methods/Modalities: Skilled observation Patient observed directly with PO's: Yes Type of PO's observed: Regular;Nectar-thick liquids (icecream) Feeding: Needs assist (due to venturi mask) Liquids provided via: Straw Type of cueing: Verbal Amount of cueing: Moderate (to take small boluses)      Chales Abrahams 02/17/2012, 11:39 AM

## 2012-02-17 NOTE — Progress Notes (Signed)
Physical Therapy Treatment Patient Details Name: Lynn Evans MRN: [id number] DOB: 03-02-1934 Today's Date: 02/17/2012 Time: 3086-5784 PT Time Calculation (min): 17 min  PT Assessment / Plan / Recommendation Comments on Treatment Session  Pt able to transfer to recliner today with +2 assist.  SaO2 dropped to 77% on venturi mask during transfer however quickly increased back up to 93% within 1-2 minutes of rest.  Pt only able to recall 1/3 hip precautions so reviewed and demonstrated posterior hip precautions.  Pt fatigued from transfer so did not perform exercises but instructed and demonstrated ankle pumps and quad sets to perform later as able.    Follow Up Recommendations  Skilled nursing facility    Barriers to Discharge        Equipment Recommendations  Defer to next venue    Recommendations for Other Services    Frequency     Plan Discharge plan remains appropriate;Frequency remains appropriate    Precautions / Restrictions Precautions Precautions: Posterior Hip;Fall Restrictions LLE Weight Bearing: Weight bearing as tolerated   Pertinent Vitals/Pain Pt reports no pain at rest however increases to 6-7/10 with mobility, and then decreases to no pain after movement.    Mobility  Bed Mobility Bed Mobility: Supine to Sit Supine to Sit: 1: +2 Total assist;HOB elevated Supine to Sit: Patient Percentage: 20% Details for Bed Mobility Assistance: pt assist to EOB with verbal cues for technique, SaO2 stayed around 93% upon sitting EOB with venturi mask. Transfers Transfers: Sit to Stand;Stand to Sit;Stand Pivot Transfers Sit to Stand: 1: +2 Total assist Sit to Stand: Patient Percentage: 60% Stand to Sit: 1: +2 Total assist Stand to Sit: Patient Percentage: 60% Stand Pivot Transfers: 1: +2 Total assist Stand Pivot Transfers: Patient Percentage: 60% Details for Transfer Assistance: verbal cues for technique, hand placement, using upper body to assist, with RW, SaO2 dropped to  77% with transfer however quickly increased to 93% with 2 minutes resting in recliner Ambulation/Gait Ambulation/Gait Assistance: Not tested (comment)    Exercises     PT Diagnosis:    PT Problem List:   PT Treatment Interventions:     PT Goals Acute Rehab PT Goals PT Goal: Supine/Side to Sit - Progress: Progressing toward goal PT Goal: Sit to Stand - Progress: Progressing toward goal PT Goal: Stand to Sit - Progress: Progressing toward goal PT Transfer Goal: Bed to Chair/Chair to Bed - Progress: Progressing toward goal  Visit Information  Last PT Received On: 02/17/12 Assistance Needed: +2    Subjective Data  Subjective: "It doesn't hurt when I'm still."  L hip   Cognition  Overall Cognitive Status: Appears within functional limits for tasks assessed/performed    Balance     End of Session PT - End of Session Activity Tolerance: Patient limited by fatigue;Other (comment) (desat with mobility) Patient left: in chair;with call bell/phone within reach;with nursing in room;with family/visitor present   GP     Dereon Williamsen,[name] 02/17/2012, 3:38 PM Pager: [phone]

## 2012-02-17 NOTE — Progress Notes (Signed)
CSW assisting with d/c planning. Spoke with pt's daughter, Jan , this am. Bed offers for SNF placement are available and were left in pt's room ( with daughter in-law ) for review. CSW will continue to follow to assist with placement needs.  Cori Razor LCSW [phone]

## 2012-02-17 NOTE — Progress Notes (Signed)
[facility] PROGRESS NOTE  Lynn Evans WUJ:[id number] DOB: 03-26-1934 DOA: 02/13/2012 PCP: Ignatius Specking., MD  Assessment/Plan:  1. Mechanical fall causing Comminuted subcapital or basicervical left femoral neck fracture status post Left hip hemiarthroplasty #5 press fit stem, +0 neck, 44 uni head by Dr. supple on 02/14/2012 - patient will receive PT OT, wound care per orthopedics, patient has persistently refused any chemical prophylaxis as she says even 81 mg of aspirin makes her nose bleed, she was counseled about the risks of DVT PE death and disability however she continues to refuse any chemical DVT prophylaxis. We'll provide her with SCDs, Pain control for now for hip pain, Ortho following Dr Rennis Chris.   2. History of atrial fibrillation patient not on Coumadin at home. Plan will be rate control- home dose Cardizem and amiodarone will be continued   3. Leukocytosis with chest x-ray changes of acute versus chronic interstitial changes- patient received 24 hours of vancomycin and Zosyn, on 02/15/2012 since her numbers looked better and she felt good she was switched to oral Levaquin, however looks like patient could have aspirated again last night 24 hours into the hospital stay, right lower lobe infiltrate is worse, this could have been secondary to constipation and Ileus that she developed. Have requested speech to see her, aspiration precautions, feeding assistance, escallate ABX to IV Vanco-Mero and IV Levaquin, oxygen and nebulizer treatments, closely monitor continuous pulse oximetry.  Pulmonary following as well ?aspiration- speech recommends MBS- don't think we can do currently based on oxygenation requirements   4. Ileus: reglan ATC, had BM yesterday with enema, add stool softeners (no nausea/vomiting), limit pain meds- replace K+, check labs in AM  5.Hypertension stable - continue home medications no acute issues.   6. Anemia of chronic disease with acute decline. with  perioperative blood loss - as post 2 units of packed RBC transfusion on 02/15/2012 followed by IV Lasix , followup H&H is stable.   7. Mild hyponatremia-  ?SIADH from surgery  8. Hypokalemia- repleat  DVT Prophylaxis - SCDs - patient has persistently refused any chemical prophylaxis as she says even 81 mg of aspirin makes her nose bleed, she was counseled about the risks of DVT PE death and disability however she continues to refuse any chemical DVT prophylaxis. Lovenox has been ordered if patient changes mind she will get it.   Procedures - Left hip hemiarthroplasty #5 press fit stem, +0 neck, 44 uni head by Dr. supple on 02/14/2012, soapsuds enema.   Consults - Ortho-S work, speech therapy.   Prognosis poor.    Code Status: DNR  Disposition Plan: SNF vs H/H   Marlin Canary, D.O.  [facility] Pager [phone]  02/17/2012, 8:52 AM  LOS: 4 days    Interim History: 78 year old Caucasian female who was admitted with mechanical fall and left femoral neck fracture, she also has history of atrial fibrillation, patient does not take any anti-coagulation and even refuses prophylactic dose Lovenox as she says she bleeds very easily, was admitted with leukocytosis and questionable right lower lobe infiltrate, during her hospital stay she has developed ileus and has large stool burden in her colon, also her right lower lobe infiltrate seems to be getting worse with increased oxygen demand. Family later informed the patient was recently admitted to [facility] intubated for acute respiratory failure due to COPD, CHF, questionable pneumonia.    Subjective: C/o abdominal fullness No nausea No vomiting No fever No chills Had BM yesterday with enema   Objective:  Filed Vitals:   02/16/12 2230 02/17/12 0000 02/17/12 0400 02/17/12 0628  BP: 130/60   134/61  Pulse: 82   89  Temp: 98.2 F (36.8 C)   97.9 F (36.6 C)  TempSrc: Oral   Oral  Resp: 16 18 18 16   Height:        Weight:    49.6 kg (109 lb 5.6 oz)  SpO2: 94% 93% 94% 91%    Intake/Output Summary (Last 24 hours) at 02/17/12 0852 Last data filed at 02/17/12 0630  Gross per 24 hour  Intake     50 ml  Output   1950 ml  Net  -1900 ml    Exam:  GEN: Awake Alert, Oriented *3, No new neurological deficits, Normal affect  HEENT: [place].AT,PERRAL  Supple Neck,No JVD, No cervical lymphadenopathy appriciated.  Symmetrical Chest wall movement, Good air movement bilaterally, right basilar rales  RRR,No Gallops,Rubs or new Murmurs, No Parasternal Heave  Decreased BS, Abd Soft but distended, Non tender, No organomegaly appriciated, No rebound -guarding or rigidity.  No Cyanosis, Clubbing or edema,  No new Rash or bruise   Data Reviewed: Basic Metabolic Panel:  Lab 02/17/12 4098 02/16/12 0420 02/15/12 0413 02/14/12 0514 02/13/12 2155  NA 133* 132* 132* 134* 133*  K 3.0* 3.3* -- -- --  CL 92* 93* 96 95* 95*  CO2 33* 29 30 31 31   GLUCOSE 105* 107* 95 103* 206*  BUN 19 22 19 17 18   CREATININE 1.11* 1.14* 1.22* 1.14* 1.19*  CALCIUM 8.2* 8.0* 8.0* 8.8 8.6  MG 1.8 -- -- -- --  PHOS -- -- -- -- --   Liver Function Tests:  Lab 02/13/12 2155 02/13/12 1655  AST 22 26  ALT 16 17  ALKPHOS 45 52  BILITOT 0.4 0.3  PROT 6.9 7.8  ALBUMIN 3.2* 3.6   No results found for this basename: LIPASE:5,AMYLASE:5 in the last 168 hours No results found for this basename: AMMONIA:5 in the last 168 hours CBC:  Lab 02/17/12 0415 02/16/12 0420 02/15/12 2049 02/15/12 0413 02/14/12 0514 02/13/12 2155 02/13/12 1655  WBC 10.7* 13.0* 10.7* 9.1 13.3* -- --  NEUTROABS -- -- -- -- -- 13.4* 21.7*  HGB 8.9* 9.8* 10.2* 7.2* 10.3* -- --  HCT 26.4* 28.9* 29.7* 22.0* 31.9* -- --  MCV 85.7 85.3 85.6 88.4 89.1 -- --  PLT 173 188 168 178 257 -- --   Cardiac Enzymes: No results found for this basename: CKTOTAL:5,CKMB:5,CKMBINDEX:5,TROPONINI:5 in the last 168 hours BNP: No components found with this basename: POCBNP:5 CBG: No  results found for this basename: GLUCAP:5 in the last 168 hours  Recent Results (from the past 240 hour(s))  URINE CULTURE     Status: Normal   Collection Time   02/14/12  6:33 AM      Component Value Range Status Comment   Specimen Description URINE, CATHETERIZED   Final    Special Requests NONE   Final    Culture  Setup Time [id number]   Final    Colony Count NO GROWTH   Final    Culture NO GROWTH   Final    Report Status 02/15/2012 FINAL   Final   SURGICAL PCR SCREEN     Status: Normal   Collection Time   02/14/12  7:11 AM      Component Value Range Status Comment   MRSA, PCR NEGATIVE  NEGATIVE Final    Staphylococcus aureus NEGATIVE  NEGATIVE Final      Studies: Dg  Chest 1 View  02/13/2012  *RADIOLOGY REPORT*  Clinical Data: Left femoral neck fracture.  Preoperative respiratory evaluation.  Former smoker with history of COPD and CHF.  CHEST - 1 VIEW  Comparison: None.  Findings: Cardiac silhouette enlarged.  Thoracic aorta atherosclerotic.  Hilar and mediastinal contours otherwise unremarkable.  Emphysematous changes throughout both lungs. Diffuse interstitial opacities which may represent chronic fibrosis, particularly at the right lung base.  No confluent airspace consolidation.  No visible pleural effusions.  IMPRESSION: Severe COPD/emphysema.  Cardiomegaly without pulmonary edema. Diffuse interstitial opacities which may be chronic and reflect interstitial fibrosis, particularly at the right lung base.  Original Report Authenticated By: Arnell Sieving, M.D.   Dg Hip 1 View Left  02/14/2012  *RADIOLOGY REPORT*  Clinical Data: Postop  LEFT HIP - 1 VIEW:  Comparison: Yesterday  Findings: Left hip hemiarthroplasty is in place.  Anatomic alignment of the osseous and prosthetic structures.  No breakage or loosening of the hardware.  IMPRESSION: Left hip hemiarthroplasty anatomically aligned.  Original Report Authenticated By: Donavan Burnet, M.D.   Dg Hip Complete Left  02/13/2012   *RADIOLOGY REPORT*  Clinical Data: Fall.  Left hip injury with pain.  LEFT HIP - COMPLETE 2+ VIEW  Comparison: None.  Findings: Comminuted subcapital or basicervical left femoral neck fracture.  Mild inferomedial joint space narrowing.  Osteopenia. No other visible fractures.  Included AP pelvis demonstrates symmetric mild joint space narrowing in the right hip.  No fractures elsewhere involving the bony pelvis.  Sacroiliac joints intact with degenerative changes. Symphysis pubis intact.  Iliofemoral atherosclerosis.  IMPRESSION: Comminuted subcapital or basicervical left femoral neck fracture.  Original Report Authenticated By: Arnell Sieving, M.D.   Dg Pelvis Portable  02/14/2012  *RADIOLOGY REPORT*  Clinical Data: Postop  PORTABLE PELVIS  Comparison: Yesterday  Findings: Left hip hemiarthroplasty is in place.  There is anatomic alignment of the osseous and prosthetic structures.  No breakage or loosening of the hardware.  IMPRESSION: Left hip hemiarthroplasty anatomically aligned.  Original Report Authenticated By: Donavan Burnet, M.D.   Dg Chest Port 1 View  02/17/2012  *RADIOLOGY REPORT*  Clinical Data: Pneumonia  PORTABLE CHEST - 1 VIEW  Comparison:   the previous day's study  Findings: Slight improvement in the lower lobe airspace opacities, right greater than left.  Mild interstitial infiltrates or edema persist.  Probable small pleural effusions.  Heart size upper limits normal.  Atheromatous aortic arch.  IMPRESSION:  1.  Slight improvement in asymmetric infiltrates with persistent small effusions.  Original Report Authenticated By: Osa Craver, M.D.   Dg Chest Port 1 View  02/16/2012  *RADIOLOGY REPORT*  Clinical Data: Cough.  Recent hip surgery.  PORTABLE CHEST - 1 VIEW  Comparison: 02/13/2012  Findings: Cardiac enlargement.  Pulmonary vascularity appears unremarkable. Emphysematous changes.  Prominent diffuse interstitial fibrosis throughout the lungs.  Developing superimposed  airspace disease may be present in the right lower lung but increased density in this area could just be due to differences in technique.  No obvious blunting of costophrenic angles.  No pneumothorax.  Calcification of the aorta.  IMPRESSION: Emphysematous changes and diffuse parenchymal fibrosis.  Possible superimposed developing airspace infiltration or pneumonia in the right lower lung.  Original Report Authenticated By: Marlon Pel, M.D.   Dg Abd Acute W/chest  02/16/2012  *RADIOLOGY REPORT*  Clinical Data: Abdominal pain, cough, shortness of breath  ACUTE ABDOMEN SERIES (ABDOMEN 2 VIEW & CHEST 1 VIEW)  Comparison: 02/13/2012  Findings: Patchy right mid/lower lung opacities, worrisome for multifocal pneumonia.  Underlying chronic interstitial markings/emphysematous changes. No pleural effusion or pneumothorax.  Cardiomediastinal silhouette is within normal limits.  Gaseous distension of the left colon.  Moderate stool in the right colon.  No disproportionate small bowel dilatation to suggest small bowel obstruction.  No evidence of free air on the lateral decubitus view.  Vascular calcifications.  Left total hip arthroplasty.  IMPRESSION: Patchy right mid/lower lung opacities, worrisome for multifocal pneumonia.  No evidence of bowel obstruction or free air.  Moderate stool in the right colon.  Gaseous distension of the left colon, possibly reflecting adynamic colonic ileus.  Original Report Authenticated By: Charline Bills, M.D.   Dg Abd Portable 1v  02/16/2012  *RADIOLOGY REPORT*  Clinical Data: Abdominal pain and distension.  PORTABLE ABDOMEN - 1 VIEW  Comparison: None.  Findings: Stool filled colon with mild dilatation of small and large bowel most consistent with ileus.  Early obstruction is not excluded and follow-up is recommended if the patient's symptoms persist.  Calcifications projected over the pelvic region likely represent injection granulomas in the soft tissues.  Diffuse vascular  calcification.  No radiopaque stones are appreciated. Degenerative changes in the spine.  Postoperative change in the left hip.  Scarring in the lung bases.  IMPRESSION: Nonspecific bowel gas pattern with mild small and large bowel prominence, likely representing ileus.  Original Report Authenticated By: Marlon Pel, M.D.    Scheduled Meds:   . albuterol  2.5 mg Nebulization Q6H  . amiodarone  200 mg Oral Daily  . bisacodyl  10 mg Rectal Once  . budesonide-formoterol  2 puff Inhalation BID  . diltiazem  180 mg Oral Daily  . docusate sodium  200 mg Oral Daily  . enoxaparin  40 mg Subcutaneous Q24H  . feeding supplement  237 mL Oral Q24H  . furosemide  40 mg Intravenous Once  . furosemide  40 mg Oral Daily  . levofloxacin (LEVAQUIN) IV  750 mg Intravenous Q48H  . meropenem (MERREM) IV  1 g Intravenous Q12H  . tiotropium  18 mcg Inhalation Daily  . vancomycin  500 mg Intravenous Q12H  . DISCONTD: docusate sodium  100 mg Oral BID  . DISCONTD: furosemide  40 mg Oral Daily  . DISCONTD: levofloxacin  750 mg Oral Q48H  . DISCONTD: metroNIDAZOLE  500 mg Oral Q8H  . DISCONTD: potassium chloride SA  20 mEq Oral Daily  . DISCONTD: potassium chloride SA  40 mEq Oral Daily   Continuous Infusions:   . 0.9 % NaCl with KCl 20 mEq / L 20 mL/hr (02/16/12 1742)  . DISCONTD: dextrose 5 % and 0.45% NaCl

## 2012-02-17 NOTE — Progress Notes (Signed)
Lynn Evans  MRN: [id number] DOB/Age: [birth year] 78 y.o. Physician: Jacquelyne Balint Procedure: Procedure(s) (LRB): ARTHROPLASTY BIPOLAR HIP (Left)     Subjective: Awakens. Minimal c/o pain. Right ankle feels better  Vital Signs Temp:  [97.9 F (36.6 C)-98.3 F (36.8 C)] 97.9 F (36.6 C) (06/26 0628) Pulse Rate:  [78-89] 89  (06/26 0628) Resp:  [16-20] 20  (06/26 1200) BP: (130-148)/(60-66) 134/61 mmHg (06/26 0628) SpO2:  [91 %-97 %] 94 % (06/26 1200) FiO2 (%):  [50 %] 50 % (06/26 0911) Weight:  [49.6 kg (109 lb 5.6 oz)] 49.6 kg (109 lb 5.6 oz) (06/26 0628)  Lab Results  Basename 02/17/12 0415 02/16/12 0420  WBC 10.7* 13.0*  HGB 8.9* 9.8*  HCT 26.4* 28.9*  PLT 173 188   BMET  Basename 02/17/12 0415 02/16/12 0420  NA 133* 132*  K 3.0* 3.3*  CL 92* 93*  CO2 33* 29  GLUCOSE 105* 107*  BUN 19 22  CREATININE 1.11* 1.14*  CALCIUM 8.2* 8.0*   INR  Date Value Range Status  02/13/2012 1.04  0.00 - 1.49 Final     Exam Left hip dressing still in good condition. Will leave. i removed her knee immobilzer as i fear possible skin breakdown. Discussed with nurse        Plan Cont hip precautions, keep pillow between legs while in bed.  [name] for Dr.Kevin Supple 02/17/2012, 12:13 PM

## 2012-02-17 NOTE — Progress Notes (Signed)
Attempted to wean patient's oxygen from 50% to 40%.  Oxygen saturations dropped to the 70's fairly quickly; attempt unsuccessful.  MPennington,RN

## 2012-02-17 NOTE — Consult Note (Signed)
Name: Lynn Evans MRN: [id number] DOB: [birth year]    LOS: 4  [facility]   History of Present Illness: 78 y/o F, former smoker, with PMH of chronic diastolic CHF, COPD on home O2, Arthritis, atrial fibrillation (not on coumadin) admitted to [facility] on 6/22 s/p fall after tripping over her oxygen tubing with subsequent pain in her left hip and inability to bear weight.  Evaluation demonstrated a questionable RLL infiltrate and leukocytosis.  Hospital course complicated by ileus with large stool burden in colon.  PCCM consulted for increasing O2 needs and increase in size of RLL infiltrate.    She had recent admit to [facility] from 4/25 - 4/30 for hypercarbic respiratory failure requiring intubation, new onset atrial fibrillation, acute renal insufficiency and pulmonary HTN.      Cultures: 6/23 UC>>>neg 6/23 MRSA PCR>>>neg  Antibiotics: Levaquin 6/24>>> Meropenem 6/25>>> Zosyn 6/22>>>   Tests / Events: 6/22 - Admit s/p fall with L hip fracture 6/23 - L Hemiarthroplasty 6/25 - increasing O2 needs, increased RLL infiltrate.  Speech eval (hx of known dysphagia since intubation 4/13).  Nectar thick liquids recommended    Vital Signs: Temp:  [97.9 F (36.6 C)-98.3 F (36.8 C)] 97.9 F (36.6 C) (06/26 0628) Pulse Rate:  [78-89] 89  (06/26 0628) Resp:  [16-20] 20  (06/26 1200) BP: (130-148)/(60-66) 134/61 mmHg (06/26 0628) SpO2:  [91 %-97 %] 94 % (06/26 1200) FiO2 (%):  [50 %] 50 % (06/26 0911) Weight:  [109 lb 5.6 oz (49.6 kg)] 109 lb 5.6 oz (49.6 kg) (06/26 0628) I/O last 3 completed shifts: In: 50 [P.O.:50] Out: 3075 [Urine:3075] O2 - 50% VM  Physical Examination: General: chronically ill elderly female in NAD Neuro: AAOx4, spirited, MAE spontaneously CV: s1s2 rrr, no m/r/g PULM: resp's even/non-labored on 50% VM, R lung with crackles, left clear GI: round /soft, decreased bsx4 Extremities: warm/dry, lower extremity edema L>R (left sided  surgery)   Ventilator settings: Vent Mode:  [-]  FiO2 (%):  [50 %] 50 %  Labs    CBC  Lab 02/17/12 0415 02/16/12 0420 02/15/12 2049  HGB 8.9* 9.8* 10.2*  HCT 26.4* 28.9* 29.7*  WBC 10.7* 13.0* 10.7*  PLT 173 188 168     BMET  Lab 02/17/12 0415 02/16/12 0420 02/15/12 0413 02/14/12 0514 02/13/12 2155  NA 133* 132* 132* 134* 133*  K 3.0* 3.3* -- -- --  CL 92* 93* 96 95* 95*  CO2 33* 29 30 31 31   GLUCOSE 105* 107* 95 103* 206*  BUN 19 22 19 17 18   CREATININE 1.11* 1.14* 1.22* 1.14* 1.19*  CALCIUM 8.2* 8.0* 8.0* 8.8 8.6  MG 1.8 -- -- -- --  PHOS -- -- -- -- --     Lab 02/13/12 2155 02/13/12 1655  INR 1.04 0.91    No results found for this basename: PHART:5,PCO2:5,PCO2ART:5,PO2:5,PO2ART:5,HCO3:5,TCO2:5,O2SAT:5 in the last 168 hours   Radiology: 6/25 CXR:  Emphysematous changes and diffuse parenchymal fibrosis. Possible superimposed developing airspace infiltration or pneumonia in the right lower lung.    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  Concern for RLL infiltrate with worsening 6/25.  Pink tinged sputum likely related to recent nose bleeds with aspiration of blood / recent intubation for surgery / and irritant cough.   On amiodarone since April but not likely source of changes noted on cxr. She has known history of dysphagia since her admit in 4/13.  ST eval concerning for current evidence of aspiration.    PULMONARY 12/19/11 ECHO>>>Pulmonary arteries: PA  peak pressure: 50mm Hg (S).  EF 55-60%    RLL Airspace disease / Fibrotic changes on CXR COPD Pulmonary HTN (by ECHO) Dysphagia Afib - rate controlled  CXR improved with lasix, PCT negative and BNP elevated.  Likely component of volume overload / CHF.    Plan: -lasix as renal fxn and BP permit - additional dose 6/25, 6/25 -continue spiriva, albuterol, symbicort  -f/u cxr am of 6/27 (patient is reluctant to have many films and daily not needed) -wean O2 to keep saturations 88-94% -follow sputum culture -minimize narcotics / sedating medications as able to reduce atx -narrow abx as PCT negative, elevated BNP and improvement in CXR with lasix   All other medical problems per TR[facility].      Canary Brim, NP-C [facility] Pgr: [phone]  02/17/2012, 12:57 PM    STAFF NOTE: I, Dr Lavinia Sharps have personally reviewed patient's available data, including medical history, events of note, physical examination and test results as part of my evaluation. I have discussed with resident/NP and other care providers such as pharmacist, RN and RRT.  In addition,  I personally evaluated patient and elicited key findings of cxr showing signs of chronic ILD/pulmonary fibrosis along with copd. She is refusing CT chest due to fear of radiation risk and I am unable to talk her out of it. Will get autoimmune lab profile to better understand one potential etiology for fibrosis.  Rest per NP/medical resident whose note is outlined above and that I agree with    Dr. Kalman Shan, M.D., [facility].C.P Pulmonary and Critical Care Medicine Staff Physician [facility] Pager: [phone], If no answer or between  15:00h - 7:00h: call [phone]  02/17/2012 3:53 PM

## 2012-02-18 DIAGNOSIS — R000 Tachycardia, unspecified: Secondary | ICD-10-CM

## 2012-02-18 DIAGNOSIS — I5032 Chronic diastolic (congestive) heart failure: Secondary | ICD-10-CM

## 2012-02-18 DIAGNOSIS — D7289 Other specified disorders of white blood cells: Secondary | ICD-10-CM

## 2012-02-18 DIAGNOSIS — S72009A Fracture of unspecified part of neck of unspecified femur, initial encounter for closed fracture: Secondary | ICD-10-CM

## 2012-02-18 LAB — CBC
HCT: 26.9 % — ABNORMAL LOW (ref 36.0–46.0)
Hemoglobin: 8.9 g/dL — ABNORMAL LOW (ref 12.0–15.0)
MCH: 28.9 pg (ref 26.0–34.0)
MCV: 87.3 fL (ref 78.0–100.0)
RBC: 3.08 MIL/uL — ABNORMAL LOW (ref 3.87–5.11)

## 2012-02-18 LAB — BASIC METABOLIC PANEL
BUN: 19 mg/dL (ref 6–23)
CO2: 34 mEq/L — ABNORMAL HIGH (ref 19–32)
Calcium: 8.6 mg/dL (ref 8.4–10.5)
Glucose, Bld: 118 mg/dL — ABNORMAL HIGH (ref 70–99)
Sodium: 134 mEq/L — ABNORMAL LOW (ref 135–145)

## 2012-02-18 LAB — SEDIMENTATION RATE: Sed Rate: 78 mm/hr — ABNORMAL HIGH (ref 0–22)

## 2012-02-18 MED ORDER — ALBUTEROL SULFATE (5 MG/ML) 0.5% IN NEBU
2.5000 mg | INHALATION_SOLUTION | Freq: Four times a day (QID) | RESPIRATORY_TRACT | Status: DC
  Administered 2012-02-18 – 2012-02-19 (×6): 2.5 mg via RESPIRATORY_TRACT
  Filled 2012-02-18 (×7): qty 0.5

## 2012-02-18 MED ORDER — POTASSIUM CHLORIDE 10 MEQ/100ML IV SOLN
10.0000 meq | INTRAVENOUS | Status: AC
  Administered 2012-02-18 (×3): 10 meq via INTRAVENOUS
  Filled 2012-02-18 (×3): qty 100

## 2012-02-18 NOTE — Progress Notes (Addendum)
Soap suds enema given with positive results.   A moderate amount of soft brown stool produced.  Pt tolerated procedure well.  Oxygen weaned today from 50% ventimask to 5 liters via [place].   She has been very alert today, talking and visiting with friends/family.  Appetite increasing as she has drank 2 Ensures today with ice cream in them.

## 2012-02-18 NOTE — Progress Notes (Signed)
OT Cancellation Note  Treatment cancelled today due to patient's refusal to participate. Pt also continuing to move slowly with +2 A with PT. Plan is for d/c to snf. Will sign off and defer OT eval to that venue.  Jaimi Belle A OTR/L [phone] 02/18/2012, 2:22 PM

## 2012-02-18 NOTE — Progress Notes (Signed)
Met with Pt and youngest daughter to discuss facility choice.    Pt and daughter unsure of choice and youngest daughter contacted Jan to discus choice.  CSW spoke with Jan who stated that she was unable to locate bed offers left for her by other CSW.  CSW informed Jan that CSW will leave offers in the room now.  Jan to review and have a decision tomorrow.  CSW left bed offers in Pt's room.  CSW thanked Pt and family for their time.  CSW to continue to follow.  Providence Crosby, LCSWA Clinical Social Work [phone]

## 2012-02-18 NOTE — Progress Notes (Signed)
PT Cancellation Note  Treatment cancelled today due to patient's refusal to participate.  Pt reports she just got back to bed after sitting up for 4 hours and declines any therapy today.  Lynn Evans,[name] 02/18/2012, 3:25 PM Pager: [phone]

## 2012-02-18 NOTE — Progress Notes (Signed)
[facility] PROGRESS NOTE  Lynn Evans ZOX:[id number] DOB: [birth year] DOA: 02/13/2012 PCP: Ignatius Specking., MD  Assessment/Plan:  1. Mechanical fall causing Comminuted subcapital or basicervical left femoral neck fracture status post Left hip hemiarthroplasty #5 press fit stem, +0 neck, 44 uni head by Dr. supple on 02/14/2012 - patient will receive PT OT, wound care per orthopedics, patient has persistently refused any chemical prophylaxis as she says even 81 mg of aspirin makes her nose bleed, she was counseled about the risks of DVT PE death and disability however she continues to refuse any chemical DVT prophylaxis. We'll provide her with SCDs, Pain control for now for hip pain, Ortho following Dr Rennis Chris.   2. History of atrial fibrillation patient not on Coumadin at home. Plan will be rate control- home dose Cardizem and amiodarone will be continued   3. Leukocytosis with chest x-ray changes of acute versus chronic interstitial changes- patient received 24 hours of vancomycin and Zosyn, on 02/15/2012 since her numbers looked better and she felt good she was switched to oral Levaquin, however looks like patient could have aspirated again last night 24 hours into the hospital stay, right lower lobe infiltrate is worse, this could have been secondary to constipation and Ileus that she developed. Have requested speech to see her, aspiration precautions, feeding assistance, escallate ABX to IV Vanco-Mero and IV Levaquin, oxygen and nebulizer treatments, closely monitor continuous pulse oximetry.  Pulmonary following as well- recommeded CT scan of chest- patient still reluctant to have- oxygenation better (5L (on 2L at home))   4. Ileus: reglan ATC, had BM yesterday with enema, add stool softeners (no nausea/vomiting), limit pain meds- replace K+, check labs in AM- good BS today  5.Hypertension stable - continue home medications no acute issues.   6. Anemia of chronic disease with acute  decline. with perioperative blood loss - as post 2 units of packed RBC transfusion on 02/15/2012 followed by IV Lasix , followup H&H is stable.   7. Mild hyponatremia-  ?SIADH from surgery  8. Hypokalemia- repleat  DVT Prophylaxis - SCDs - patient has persistently refused any chemical prophylaxis as she says even 81 mg of aspirin makes her nose bleed, she was counseled about the risks of DVT PE death and disability however she continues to refuse any chemical DVT prophylaxis. Lovenox has been ordered if patient changes mind she will get it.   Procedures - Left hip hemiarthroplasty #5 press fit stem, +0 neck, 44 uni head by Dr. supple on 02/14/2012, soapsuds enema.   Consults - Ortho-S work, speech therapy.   Prognosis poor.   Plan to change diet once patient has BM to:  mechanical soft/ground meats for energy conservation and continue nectar liquids.   Code Status: DNR  Disposition Plan: SNF vs H/H   Marlin Canary, D.O.  [facility] Pager [phone]  02/18/2012, 10:52 AM  LOS: 5 days    Interim History: 78 year old Caucasian female who was admitted with mechanical fall and left femoral neck fracture, she also has history of atrial fibrillation, patient does not take any anti-coagulation and even refuses prophylactic dose Lovenox as she says she bleeds very easily, was admitted with leukocytosis and questionable right lower lobe infiltrate, during her hospital stay she has developed ileus and has large stool burden in her colon, also her right lower lobe infiltrate seems to be getting worse with increased oxygen demand. Family later informed the patient was recently admitted to [facility] intubated for acute respiratory failure due to  COPD, CHF, questionable pneumonia.    Subjective: Up in chair No sob No cp No fever No chills    Objective: Filed Vitals:   02/18/12 0000 02/18/12 0233 02/18/12 0700 02/18/12 0904  BP:   137/56   Pulse:   73   Temp:   97.6 F  (36.4 C)   TempSrc:   Oral   Resp: 20  20   Height:      Weight:      SpO2: 98% 91% 94% 89%    Intake/Output Summary (Last 24 hours) at 02/18/12 1052 Last data filed at 02/18/12 0900  Gross per 24 hour  Intake    800 ml  Output   1800 ml  Net  -1000 ml    Exam:  GEN: Awake Alert, Oriented *3, No new neurological deficits, ill appearing HEENT: [place].AT,PERRAL  Supple Neck,No JVD, No cervical lymphadenopathy appriciated.  Symmetrical Chest wall movement, Good air movement bilaterally, right basilar rales  RRR,No Gallops,Rubs or new Murmurs, No Parasternal Heave  better BS today, active, Abd Soft but distended, Non tender, No organomegaly appriciated, No rebound -guarding or rigidity.  No Cyanosis, Clubbing or edema,  No new Rash or bruise   Data Reviewed: Basic Metabolic Panel:  Lab 02/18/12 1191 02/17/12 0415 02/16/12 0420 02/15/12 0413 02/14/12 0514  NA 134* 133* 132* 132* 134*  K 3.3* 3.0* -- -- --  CL 92* 92* 93* 96 95*  CO2 34* 33* 29 30 31   GLUCOSE 118* 105* 107* 95 103*  BUN 19 19 22 19 17   CREATININE 1.04 1.11* 1.14* 1.22* 1.14*  CALCIUM 8.6 8.2* 8.0* 8.0* 8.8  MG 1.9 1.8 -- -- --  PHOS -- -- -- -- --   Liver Function Tests:  Lab 02/13/12 2155 02/13/12 1655  AST 22 26  ALT 16 17  ALKPHOS 45 52  BILITOT 0.4 0.3  PROT 6.9 7.8  ALBUMIN 3.2* 3.6   No results found for this basename: LIPASE:5,AMYLASE:5 in the last 168 hours No results found for this basename: AMMONIA:5 in the last 168 hours CBC:  Lab 02/18/12 0420 02/17/12 0415 02/16/12 0420 02/15/12 2049 02/15/12 0413 02/13/12 2155 02/13/12 1655  WBC 10.6* 10.7* 13.0* 10.7* 9.1 -- --  NEUTROABS -- -- -- -- -- 13.4* 21.7*  HGB 8.9* 8.9* 9.8* 10.2* 7.2* -- --  HCT 26.9* 26.4* 28.9* 29.7* 22.0* -- --  MCV 87.3 85.7 85.3 85.6 88.4 -- --  PLT 216 173 188 168 178 -- --   Cardiac Enzymes: No results found for this basename: CKTOTAL:5,CKMB:5,CKMBINDEX:5,TROPONINI:5 in the last 168 hours BNP: No components  found with this basename: POCBNP:5 CBG: No results found for this basename: GLUCAP:5 in the last 168 hours  Recent Results (from the past 240 hour(s))  URINE CULTURE     Status: Normal   Collection Time   02/14/12  6:33 AM      Component Value Range Status Comment   Specimen Description URINE, CATHETERIZED   Final    Special Requests NONE   Final    Culture  Setup Time [id number]   Final    Colony Count NO GROWTH   Final    Culture NO GROWTH   Final    Report Status 02/15/2012 FINAL   Final   SURGICAL PCR SCREEN     Status: Normal   Collection Time   02/14/12  7:11 AM      Component Value Range Status Comment   MRSA, PCR NEGATIVE  NEGATIVE Final  Staphylococcus aureus NEGATIVE  NEGATIVE Final      Studies: Dg Chest 1 View  02/13/2012  *RADIOLOGY REPORT*  Clinical Data: Left femoral neck fracture.  Preoperative respiratory evaluation.  Former smoker with history of COPD and CHF.  CHEST - 1 VIEW  Comparison: None.  Findings: Cardiac silhouette enlarged.  Thoracic aorta atherosclerotic.  Hilar and mediastinal contours otherwise unremarkable.  Emphysematous changes throughout both lungs. Diffuse interstitial opacities which may represent chronic fibrosis, particularly at the right lung base.  No confluent airspace consolidation.  No visible pleural effusions.  IMPRESSION: Severe COPD/emphysema.  Cardiomegaly without pulmonary edema. Diffuse interstitial opacities which may be chronic and reflect interstitial fibrosis, particularly at the right lung base.  Original Report Authenticated By: Arnell Sieving, M.D.   Dg Hip 1 View Left  02/14/2012  *RADIOLOGY REPORT*  Clinical Data: Postop  LEFT HIP - 1 VIEW:  Comparison: Yesterday  Findings: Left hip hemiarthroplasty is in place.  Anatomic alignment of the osseous and prosthetic structures.  No breakage or loosening of the hardware.  IMPRESSION: Left hip hemiarthroplasty anatomically aligned.  Original Report Authenticated By: Donavan Burnet, M.D.   Dg Hip Complete Left  02/13/2012  *RADIOLOGY REPORT*  Clinical Data: Fall.  Left hip injury with pain.  LEFT HIP - COMPLETE 2+ VIEW  Comparison: None.  Findings: Comminuted subcapital or basicervical left femoral neck fracture.  Mild inferomedial joint space narrowing.  Osteopenia. No other visible fractures.  Included AP pelvis demonstrates symmetric mild joint space narrowing in the right hip.  No fractures elsewhere involving the bony pelvis.  Sacroiliac joints intact with degenerative changes. Symphysis pubis intact.  Iliofemoral atherosclerosis.  IMPRESSION: Comminuted subcapital or basicervical left femoral neck fracture.  Original Report Authenticated By: Arnell Sieving, M.D.   Dg Pelvis Portable  02/14/2012  *RADIOLOGY REPORT*  Clinical Data: Postop  PORTABLE PELVIS  Comparison: Yesterday  Findings: Left hip hemiarthroplasty is in place.  There is anatomic alignment of the osseous and prosthetic structures.  No breakage or loosening of the hardware.  IMPRESSION: Left hip hemiarthroplasty anatomically aligned.  Original Report Authenticated By: Donavan Burnet, M.D.   Dg Chest Port 1 View  02/17/2012  *RADIOLOGY REPORT*  Clinical Data: Pneumonia  PORTABLE CHEST - 1 VIEW  Comparison:   the previous day's study  Findings: Slight improvement in the lower lobe airspace opacities, right greater than left.  Mild interstitial infiltrates or edema persist.  Probable small pleural effusions.  Heart size upper limits normal.  Atheromatous aortic arch.  IMPRESSION:  1.  Slight improvement in asymmetric infiltrates with persistent small effusions.  Original Report Authenticated By: Osa Craver, M.D.   Dg Chest Port 1 View  02/16/2012  *RADIOLOGY REPORT*  Clinical Data: Cough.  Recent hip surgery.  PORTABLE CHEST - 1 VIEW  Comparison: 02/13/2012  Findings: Cardiac enlargement.  Pulmonary vascularity appears unremarkable. Emphysematous changes.  Prominent diffuse interstitial fibrosis  throughout the lungs.  Developing superimposed airspace disease may be present in the right lower lung but increased density in this area could just be due to differences in technique.  No obvious blunting of costophrenic angles.  No pneumothorax.  Calcification of the aorta.  IMPRESSION: Emphysematous changes and diffuse parenchymal fibrosis.  Possible superimposed developing airspace infiltration or pneumonia in the right lower lung.  Original Report Authenticated By: Marlon Pel, M.D.   Dg Abd Acute W/chest  02/16/2012  *RADIOLOGY REPORT*  Clinical Data: Abdominal pain, cough, shortness of breath  ACUTE  ABDOMEN SERIES (ABDOMEN 2 VIEW & CHEST 1 VIEW)  Comparison: 02/13/2012  Findings: Patchy right mid/lower lung opacities, worrisome for multifocal pneumonia.  Underlying chronic interstitial markings/emphysematous changes. No pleural effusion or pneumothorax.  Cardiomediastinal silhouette is within normal limits.  Gaseous distension of the left colon.  Moderate stool in the right colon.  No disproportionate small bowel dilatation to suggest small bowel obstruction.  No evidence of free air on the lateral decubitus view.  Vascular calcifications.  Left total hip arthroplasty.  IMPRESSION: Patchy right mid/lower lung opacities, worrisome for multifocal pneumonia.  No evidence of bowel obstruction or free air.  Moderate stool in the right colon.  Gaseous distension of the left colon, possibly reflecting adynamic colonic ileus.  Original Report Authenticated By: Charline Bills, M.D.   Dg Abd Portable 1v  02/16/2012  *RADIOLOGY REPORT*  Clinical Data: Abdominal pain and distension.  PORTABLE ABDOMEN - 1 VIEW  Comparison: None.  Findings: Stool filled colon with mild dilatation of small and large bowel most consistent with ileus.  Early obstruction is not excluded and follow-up is recommended if the patient's symptoms persist.  Calcifications projected over the pelvic region likely represent injection  granulomas in the soft tissues.  Diffuse vascular calcification.  No radiopaque stones are appreciated. Degenerative changes in the spine.  Postoperative change in the left hip.  Scarring in the lung bases.  IMPRESSION: Nonspecific bowel gas pattern with mild small and large bowel prominence, likely representing ileus.  Original Report Authenticated By: Marlon Pel, M.D.    Scheduled Meds:    . albuterol  2.5 mg Nebulization QID  . amiodarone  200 mg Oral Daily  . bisacodyl  10 mg Rectal Once  . budesonide-formoterol  2 puff Inhalation BID  . diltiazem  180 mg Oral Daily  . docusate sodium  200 mg Oral Daily  . enoxaparin  40 mg Subcutaneous Q24H  . feeding supplement  237 mL Oral Q24H  . furosemide  40 mg Intravenous Once  . furosemide  40 mg Oral Daily  . levofloxacin (LEVAQUIN) IV  750 mg Intravenous Q48H  . metoCLOPramide (REGLAN) injection  5 mg Intravenous Q8H  . potassium chloride  10 mEq Intravenous Q1 Hr x 3  . tiotropium  18 mcg Inhalation Daily  . DISCONTD: albuterol  2.5 mg Nebulization Q6H  . DISCONTD: meropenem (MERREM) IV  1 g Intravenous Q12H  . DISCONTD: vancomycin  500 mg Intravenous Q12H   Continuous Infusions:    . 0.9 % NaCl with KCl 20 mEq / L 20 mL/hr (02/16/12 1742)

## 2012-02-18 NOTE — Progress Notes (Signed)
SLP Cancellation Note  Treatment cancelled today due to pt continues on liquid diet awaiting BM per MD note.  Will follow up when diet advanced to assure tolerance.  Thanks. Donavan Burnet, MS [facility] SLP [phone]

## 2012-02-19 DIAGNOSIS — R000 Tachycardia, unspecified: Secondary | ICD-10-CM

## 2012-02-19 DIAGNOSIS — D7289 Other specified disorders of white blood cells: Secondary | ICD-10-CM

## 2012-02-19 DIAGNOSIS — S72009A Fracture of unspecified part of neck of unspecified femur, initial encounter for closed fracture: Secondary | ICD-10-CM

## 2012-02-19 DIAGNOSIS — I5032 Chronic diastolic (congestive) heart failure: Secondary | ICD-10-CM

## 2012-02-19 LAB — BASIC METABOLIC PANEL
BUN: 22 mg/dL (ref 6–23)
Chloride: 94 mEq/L — ABNORMAL LOW (ref 96–112)
GFR calc Af Amer: 58 mL/min — ABNORMAL LOW (ref >90)
Potassium: 4.1 mEq/L (ref 3.5–5.1)
Sodium: 133 mEq/L — ABNORMAL LOW (ref 135–145)

## 2012-02-19 LAB — CYCLIC CITRUL PEPTIDE ANTIBODY, IGG: Cyclic Citrullin Peptide Ab: <2 U/mL (ref 0.0–5.0)

## 2012-02-19 LAB — CBC
HCT: 27.5 % — ABNORMAL LOW (ref 36.0–46.0)
RDW: 17.2 % — ABNORMAL HIGH (ref 11.5–15.5)
WBC: 9.7 10*3/uL (ref 4.0–10.5)

## 2012-02-19 LAB — SJOGRENS SYNDROME-B EXTRACTABLE NUCLEAR ANTIBODY: SSB (La) (ENA) Antibody, IgG: <1 AU/mL (ref <30)

## 2012-02-19 MED ORDER — ALBUTEROL SULFATE (5 MG/ML) 0.5% IN NEBU
2.5000 mg | INHALATION_SOLUTION | Freq: Three times a day (TID) | RESPIRATORY_TRACT | Status: DC
  Administered 2012-02-19 – 2012-02-21 (×7): 2.5 mg via RESPIRATORY_TRACT
  Filled 2012-02-19 (×7): qty 0.5

## 2012-02-19 NOTE — Progress Notes (Signed)
Vyla Pint  MRN: [id number] DOB/Age: 76-Aug-1935 76 y.o. Physician: Jacquelyne Balint Procedure: Procedure(s) (LRB): ARTHROPLASTY BIPOLAR HIP (Left)     Subjective: Up in chair. No longer on face mask o2. States she took step today. Hip sore but otherwise no complaints  Vital Signs Temp:  [97.9 F (36.6 C)-98.3 F (36.8 C)] 97.9 F (36.6 C) (06/28 0532) Pulse Rate:  [71-81] 71  (06/28 1021) Resp:  [20-22] 20  (06/28 0532) BP: (134-164)/(61-64) 164/64 mmHg (06/28 1021) SpO2:  [93 %-97 %] 95 % (06/28 0827) Weight:  [47.3 kg (104 lb 4.4 oz)] 47.3 kg (104 lb 4.4 oz) (06/28 0532)  Lab Results  Basename 02/19/12 0420 02/18/12 0420  WBC 9.7 10.6*  HGB 8.9* 8.9*  HCT 27.5* 26.9*  PLT 213 216   BMET  Basename 02/19/12 0420 02/18/12 0420  NA 133* 134*  K 4.1 3.3*  CL 94* 92*  CO2 33* 34*  GLUCOSE 104* 118*  BUN 22 19  CREATININE 1.05 1.04  CALCIUM 8.7 8.6   INR  Date Value Range Status  02/13/2012 1.04  0.00 - 1.49 Final     Exam Left hip dressing dry. NVI        Plan Cont Pt/Ot per [facility] protocol.  Probable for skilled bed soon per notes. From our standpoint she is orthopaedically stable and she can shower and would follow up with [place] as outpatient in 3 weeks.  Omere Marti for Dr.Kevin Supple 02/19/2012, 10:50 AM

## 2012-02-19 NOTE — Progress Notes (Signed)
Physical Therapy Treatment Patient Details Name: Lynn Evans MRN: [id number] DOB: [birth year] Today's Date: 02/19/2012 Time: 0865-7846 PT Time Calculation (min): 20 min  PT Assessment / Plan / Recommendation Comments on Treatment Session  Pt able to ambulate 8 feet today with +2 assist.  Pt ambulated on 6L [place] and dropped to 80% (may have been due to pushing down on RW) but quickly increased back to 93% upon sitting in recliner on 5L.    Follow Up Recommendations  Skilled nursing facility;Supervision/Assistance - 24 hour    Barriers to Discharge        Equipment Recommendations  Defer to next venue    Recommendations for Other Services    Frequency     Plan Discharge plan remains appropriate;Frequency remains appropriate    Precautions / Restrictions Precautions Precautions: Posterior Hip;Fall Restrictions LLE Weight Bearing: Weight bearing as tolerated   Pertinent Vitals/Pain 5/10 L hip pain with mobility, RN notified and gave pain meds    Mobility  Bed Mobility Bed Mobility: Supine to Sit;Scooting to HOB Supine to Sit: With rails;3: Mod assist Scooting to [facility]: 2: Max assist Details for Bed Mobility Assistance: assist for L LE and trunk, verbal cues for technique Transfers Transfers: Sit to Stand;Stand to Sit Sit to Stand: 1: +2 Total assist;From bed;With upper extremity assist Sit to Stand: Patient Percentage: 60% Stand to Sit: 1: +2 Total assist;To chair/3-in-1 Stand to Sit: Patient Percentage: 60% Details for Transfer Assistance: verbal cues for safe technique, pt did not reach back for armrest upon sitting so controlled descent manually Ambulation/Gait Ambulation/Gait Assistance: 1: +2 Total assist Ambulation/Gait: Patient Percentage: 60% Ambulation Distance (Feet): 8 Feet Assistive device: Rolling walker Ambulation/Gait Assistance Details: verbal cues for use of RW, sequence, safe technique, assist for weakness and pain, SaO2 dropped to 80% on 6L oxygen however  increased quickly back to 93% on 5L upon returning to chair (maybe from pushing down on RW) Gait Pattern: Step-to pattern;Decreased stance time - left;Antalgic Gait velocity: decreased    Exercises     PT Diagnosis:    PT Problem List:   PT Treatment Interventions:     PT Goals Acute Rehab PT Goals PT Goal: Supine/Side to Sit - Progress: Progressing toward goal PT Goal: Sit to Stand - Progress: Progressing toward goal PT Goal: Stand to Sit - Progress: Progressing toward goal PT Goal: Ambulate - Progress: Progressing toward goal  Visit Information  Last PT Received On: 02/19/12 Assistance Needed: +2    Subjective Data  Subjective: "I'll give it a try."  (ambulation)   Cognition  Overall Cognitive Status: Appears within functional limits for tasks assessed/performed    Balance     End of Session PT - End of Session Equipment Utilized During Treatment: Gait belt;Oxygen Activity Tolerance: Patient limited by fatigue;Patient limited by pain Patient left: in chair;with call bell/phone within reach;with nursing in room   GP     [facility] 02/19/2012, 12:33 PM Pager: [phone]

## 2012-02-19 NOTE — Progress Notes (Addendum)
ANTIBIOTIC CONSULT NOTE - FOLLOW UP  Pharmacy Consult for Levaquin Indication: rule out pneumonia  No Known Allergies  Patient Measurements: Height: 5' (152.4 cm) Weight: 104 lb 4.4 oz (47.3 kg) IBW/kg (Calculated) : 45.5   Vital Signs: Temp: 97.9 F (36.6 C) (06/28 0532) Temp src: Oral (06/28 0532) BP: 164/64 mmHg (06/28 1021) Pulse Rate: 71  (06/28 1021)  Labs:  Basename 02/19/12 0420 02/18/12 0420 02/17/12 0415  WBC 9.7 10.6* 10.7*  HGB 8.9* 8.9* 8.9*  PLT 213 216 173  LABCREA -- -- --  CREATININE 1.05 1.04 1.11*   Estimated Creatinine Clearance: 32.7 ml/min (by C-G formula based on Cr of 1.05).  Microbiology: Recent Results (from the past 720 hour(s))  URINE CULTURE     Status: Normal   Collection Time   02/14/12  6:33 AM      Component Value Range Status Comment   Specimen Description URINE, CATHETERIZED   Final    Special Requests NONE   Final    Culture  Setup Time [id number]   Final    Colony Count NO GROWTH   Final    Culture NO GROWTH   Final    Report Status 02/15/2012 FINAL   Final   SURGICAL PCR SCREEN     Status: Normal   Collection Time   02/14/12  7:11 AM      Component Value Range Status Comment   MRSA, PCR NEGATIVE  NEGATIVE Final    Staphylococcus aureus NEGATIVE  NEGATIVE Final     Assessment: 78 YOF s/p fall presented with L femoral neck fracture.  Started on empiric abx for leukocytosis (26.2K) and right lower lobe infiltrate on admission then broadened for possible aspiration.  Has since been narrowed to Levaquin (today is day #5 Levaquin, day #7 of total antibiotics).  WBC now WNL, patient is afebrile, renal function is stable (CrCl~32 ml/min) and cultures are negative.  CXR 6/26 showed improvement in chest infiltrates.  Goal of Therapy:  dose adjusted per renal clearance  Plan:  Continue Levaquin 750 mg IV q48h. MD, please be advised on the high risk of QTc prolongation with the combination of Levaquin and Amiodarone. Would monitor  QTc as precaution while Levaquin is continued.   Clance Boll 02/19/2012,1:47 PM

## 2012-02-19 NOTE — Progress Notes (Signed)
[facility] PROGRESS NOTE  Lynn Evans JYN:[id number] DOB: 1934-05-23 DOA: 02/13/2012 PCP: Ignatius Specking., MD  Assessment/Plan:  1. Mechanical fall causing Comminuted subcapital or basicervical left femoral neck fracture status post Left hip hemiarthroplasty #5 press fit stem, +0 neck, 44 uni head by Dr. supple on 02/14/2012 - patient will receive PT OT, wound care per orthopedics, patient has persistently refused any chemical prophylaxis as she says even 81 mg of aspirin makes her nose bleed, she was counseled about the risks of DVT PE death and disability however she continues to refuse any chemical DVT prophylaxis. We'll provide her with SCDs, Pain control for now for hip pain, Ortho following Dr Rennis Chris.   2. History of atrial fibrillation patient not on Coumadin at home. Plan will be rate control- home dose Cardizem and amiodarone will be continued   3. Leukocytosis with chest x-ray changes of acute versus chronic interstitial changes- patient received 24 hours of vancomycin and Zosyn, on 02/15/2012 since her numbers looked better and she felt good she was switched to oral Levaquin, however looks like patient could have aspirated again last night 24 hours into the hospital stay, right lower lobe infiltrate is worse, this could have been secondary to constipation and Ileus that she developed. Have requested speech to see her, aspiration precautions, feeding assistance, escallate ABX to IV Vanco-Mero and IV Levaquin, oxygen and nebulizer treatments, closely monitor continuous pulse oximetry.  Pulmonary following as well- recommeded CT scan of chest- patient still reluctant to have- oxygenation better (5L (on 2L at home))  4. Ileus: reglan ATC, had BM yesterday with enema, add stool softeners (no nausea/vomiting), limit pain meds- replace K+, check labs in AM- good BS today  5.Hypertension stable - continue home medications no acute issues.   6. Anemia of chronic disease with acute  decline. with perioperative blood loss - as post 2 units of packed RBC transfusion on 02/15/2012 followed by IV Lasix , followup H&H is stable.   7. Mild hyponatremia-  ?SIADH from surgery  8. Hypokalemia- repleat  DVT Prophylaxis - SCDs - patient has persistently refused any chemical prophylaxis as she says even 81 mg of aspirin makes her nose bleed, she was counseled about the risks of DVT PE death and disability however she continues to refuse any chemical DVT prophylaxis. Lovenox has been ordered if patient changes mind she will get it.   Procedures - Left hip hemiarthroplasty #5 press fit stem, +0 neck, 44 uni head by Dr. supple on 02/14/2012, soapsuds enema.   Consults - Ortho-S work, speech therapy.   Prognosis poor.   Plan to change diet: mechanical soft/ground meats for energy conservation and continue nectar liquids- aspiration precuations  Code Status: DNR  Disposition Plan: SNF vs H/H   Marlin Canary, D.O.  [facility] Pager [phone]  02/19/2012, 8:57 AM  LOS: 6 days    Interim History: 78 year old Caucasian female who was admitted with mechanical fall and left femoral neck fracture, she also has history of atrial fibrillation, patient does not take any anti-coagulation and even refuses prophylactic dose Lovenox as she says she bleeds very easily, was admitted with leukocytosis and questionable right lower lobe infiltrate, during her hospital stay she has developed ileus and has large stool burden in her colon, also her right lower lobe infiltrate seems to be getting worse with increased oxygen demand. Family later informed the patient was recently admitted to [facility] intubated for acute respiratory failure due to COPD, CHF, questionable pneumonia.  Subjective: Breathing better Had BM with enema yesterday No fever, no chills Says she know she needs to get up with PT   Objective: Filed Vitals:   02/18/12 2025 02/18/12 2232 02/19/12 0532 02/19/12  0827  BP:  144/61 134/64   Pulse:  81 74   Temp:  98.3 F (36.8 C) 97.9 F (36.6 C)   TempSrc:  Oral Oral   Resp:  20 20   Height:      Weight:   47.3 kg (104 lb 4.4 oz)   SpO2: 93% 97% 96% 95%    Intake/Output Summary (Last 24 hours) at 02/19/12 0857 Last data filed at 02/19/12 0538  Gross per 24 hour  Intake    840 ml  Output    651 ml  Net    189 ml    Exam:  GEN: Awake Alert, Oriented *3, No new neurological deficits, ill appearing HEENT: [place].AT,PERRAL  Supple Neck,No JVD, No cervical lymphadenopathy appriciated.  Symmetrical Chest wall movement, Good air movement bilaterally, right basilar rales  RRR,No Gallops,Rubs or new Murmurs, No Parasternal Heave  better BS today, active, Abd Soft but distended, Non tender, No organomegaly appriciated, No rebound -guarding or rigidity.  No Cyanosis, Clubbing or edema,  No new Rash or bruise   Data Reviewed: Basic Metabolic Panel:  Lab 02/19/12 9604 02/18/12 0420 02/17/12 0415 02/16/12 0420 02/15/12 0413  NA 133* 134* 133* 132* 132*  K 4.1 3.3* -- -- --  CL 94* 92* 92* 93* 96  CO2 33* 34* 33* 29 30  GLUCOSE 104* 118* 105* 107* 95  BUN 22 19 19 22 19   CREATININE 1.05 1.04 1.11* 1.14* 1.22*  CALCIUM 8.7 8.6 8.2* 8.0* 8.0*  MG -- 1.9 1.8 -- --  PHOS -- -- -- -- --   Liver Function Tests:  Lab 02/13/12 2155 02/13/12 1655  AST 22 26  ALT 16 17  ALKPHOS 45 52  BILITOT 0.4 0.3  PROT 6.9 7.8  ALBUMIN 3.2* 3.6   No results found for this basename: LIPASE:5,AMYLASE:5 in the last 168 hours No results found for this basename: AMMONIA:5 in the last 168 hours CBC:  Lab 02/19/12 0420 02/18/12 0420 02/17/12 0415 02/16/12 0420 02/15/12 2049 02/13/12 2155 02/13/12 1655  WBC 9.7 10.6* 10.7* 13.0* 10.7* -- --  NEUTROABS -- -- -- -- -- 13.4* 21.7*  HGB 8.9* 8.9* 8.9* 9.8* 10.2* -- --  HCT 27.5* 26.9* 26.4* 28.9* 29.7* -- --  MCV 89.0 87.3 85.7 85.3 85.6 -- --  PLT 213 216 173 188 168 -- --   Cardiac Enzymes: No results found  for this basename: CKTOTAL:5,CKMB:5,CKMBINDEX:5,TROPONINI:5 in the last 168 hours BNP: No components found with this basename: POCBNP:5 CBG: No results found for this basename: GLUCAP:5 in the last 168 hours  Recent Results (from the past 240 hour(s))  URINE CULTURE     Status: Normal   Collection Time   02/14/12  6:33 AM      Component Value Range Status Comment   Specimen Description URINE, CATHETERIZED   Final    Special Requests NONE   Final    Culture  Setup Time [id number]   Final    Colony Count NO GROWTH   Final    Culture NO GROWTH   Final    Report Status 02/15/2012 FINAL   Final   SURGICAL PCR SCREEN     Status: Normal   Collection Time   02/14/12  7:11 AM      Component Value  Range Status Comment   MRSA, PCR NEGATIVE  NEGATIVE Final    Staphylococcus aureus NEGATIVE  NEGATIVE Final      Studies: Dg Chest 1 View  02/13/2012  *RADIOLOGY REPORT*  Clinical Data: Left femoral neck fracture.  Preoperative respiratory evaluation.  Former smoker with history of COPD and CHF.  CHEST - 1 VIEW  Comparison: None.  Findings: Cardiac silhouette enlarged.  Thoracic aorta atherosclerotic.  Hilar and mediastinal contours otherwise unremarkable.  Emphysematous changes throughout both lungs. Diffuse interstitial opacities which may represent chronic fibrosis, particularly at the right lung base.  No confluent airspace consolidation.  No visible pleural effusions.  IMPRESSION: Severe COPD/emphysema.  Cardiomegaly without pulmonary edema. Diffuse interstitial opacities which may be chronic and reflect interstitial fibrosis, particularly at the right lung base.  Original Report Authenticated By: Arnell Sieving, M.D.   Dg Hip 1 View Left  02/14/2012  *RADIOLOGY REPORT*  Clinical Data: Postop  LEFT HIP - 1 VIEW:  Comparison: Yesterday  Findings: Left hip hemiarthroplasty is in place.  Anatomic alignment of the osseous and prosthetic structures.  No breakage or loosening of the hardware.   IMPRESSION: Left hip hemiarthroplasty anatomically aligned.  Original Report Authenticated By: Donavan Burnet, M.D.   Dg Hip Complete Left  02/13/2012  *RADIOLOGY REPORT*  Clinical Data: Fall.  Left hip injury with pain.  LEFT HIP - COMPLETE 2+ VIEW  Comparison: None.  Findings: Comminuted subcapital or basicervical left femoral neck fracture.  Mild inferomedial joint space narrowing.  Osteopenia. No other visible fractures.  Included AP pelvis demonstrates symmetric mild joint space narrowing in the right hip.  No fractures elsewhere involving the bony pelvis.  Sacroiliac joints intact with degenerative changes. Symphysis pubis intact.  Iliofemoral atherosclerosis.  IMPRESSION: Comminuted subcapital or basicervical left femoral neck fracture.  Original Report Authenticated By: Arnell Sieving, M.D.   Dg Pelvis Portable  02/14/2012  *RADIOLOGY REPORT*  Clinical Data: Postop  PORTABLE PELVIS  Comparison: Yesterday  Findings: Left hip hemiarthroplasty is in place.  There is anatomic alignment of the osseous and prosthetic structures.  No breakage or loosening of the hardware.  IMPRESSION: Left hip hemiarthroplasty anatomically aligned.  Original Report Authenticated By: Donavan Burnet, M.D.   Dg Chest Port 1 View  02/17/2012  *RADIOLOGY REPORT*  Clinical Data: Pneumonia  PORTABLE CHEST - 1 VIEW  Comparison:   the previous day's study  Findings: Slight improvement in the lower lobe airspace opacities, right greater than left.  Mild interstitial infiltrates or edema persist.  Probable small pleural effusions.  Heart size upper limits normal.  Atheromatous aortic arch.  IMPRESSION:  1.  Slight improvement in asymmetric infiltrates with persistent small effusions.  Original Report Authenticated By: Osa Craver, M.D.   Dg Chest Port 1 View  02/16/2012  *RADIOLOGY REPORT*  Clinical Data: Cough.  Recent hip surgery.  PORTABLE CHEST - 1 VIEW  Comparison: 02/13/2012  Findings: Cardiac enlargement.   Pulmonary vascularity appears unremarkable. Emphysematous changes.  Prominent diffuse interstitial fibrosis throughout the lungs.  Developing superimposed airspace disease may be present in the right lower lung but increased density in this area could just be due to differences in technique.  No obvious blunting of costophrenic angles.  No pneumothorax.  Calcification of the aorta.  IMPRESSION: Emphysematous changes and diffuse parenchymal fibrosis.  Possible superimposed developing airspace infiltration or pneumonia in the right lower lung.  Original Report Authenticated By: Marlon Pel, M.D.   Dg Abd Acute W/chest  02/16/2012  *  RADIOLOGY REPORT*  Clinical Data: Abdominal pain, cough, shortness of breath  ACUTE ABDOMEN SERIES (ABDOMEN 2 VIEW & CHEST 1 VIEW)  Comparison: 02/13/2012  Findings: Patchy right mid/lower lung opacities, worrisome for multifocal pneumonia.  Underlying chronic interstitial markings/emphysematous changes. No pleural effusion or pneumothorax.  Cardiomediastinal silhouette is within normal limits.  Gaseous distension of the left colon.  Moderate stool in the right colon.  No disproportionate small bowel dilatation to suggest small bowel obstruction.  No evidence of free air on the lateral decubitus view.  Vascular calcifications.  Left total hip arthroplasty.  IMPRESSION: Patchy right mid/lower lung opacities, worrisome for multifocal pneumonia.  No evidence of bowel obstruction or free air.  Moderate stool in the right colon.  Gaseous distension of the left colon, possibly reflecting adynamic colonic ileus.  Original Report Authenticated By: Charline Bills, M.D.   Dg Abd Portable 1v  02/16/2012  *RADIOLOGY REPORT*  Clinical Data: Abdominal pain and distension.  PORTABLE ABDOMEN - 1 VIEW  Comparison: None.  Findings: Stool filled colon with mild dilatation of small and large bowel most consistent with ileus.  Early obstruction is not excluded and follow-up is recommended if the  patient's symptoms persist.  Calcifications projected over the pelvic region likely represent injection granulomas in the soft tissues.  Diffuse vascular calcification.  No radiopaque stones are appreciated. Degenerative changes in the spine.  Postoperative change in the left hip.  Scarring in the lung bases.  IMPRESSION: Nonspecific bowel gas pattern with mild small and large bowel prominence, likely representing ileus.  Original Report Authenticated By: Marlon Pel, M.D.    Scheduled Meds:    . albuterol  2.5 mg Nebulization QID  . amiodarone  200 mg Oral Daily  . bisacodyl  10 mg Rectal Once  . budesonide-formoterol  2 puff Inhalation BID  . diltiazem  180 mg Oral Daily  . docusate sodium  200 mg Oral Daily  . enoxaparin  40 mg Subcutaneous Q24H  . feeding supplement  237 mL Oral Q24H  . furosemide  40 mg Oral Daily  . levofloxacin (LEVAQUIN) IV  750 mg Intravenous Q48H  . metoCLOPramide (REGLAN) injection  5 mg Intravenous Q8H  . potassium chloride  10 mEq Intravenous Q1 Hr x 3  . tiotropium  18 mcg Inhalation Daily   Continuous Infusions:

## 2012-02-19 NOTE — Progress Notes (Signed)
Per MD, Pt not medically ready for d/c.    Per MD, d/c not likely until Monday.  Met with Pt and daughter, Jan.  Family has chosen [facility].  LM for Tammy at [facility] with Pt's information and likely d/c date.  CSW Tresa Endo to f/u on Monday.  Providence Crosby, LCSWA Clinical Social Work [phone]

## 2012-02-19 NOTE — Progress Notes (Signed)
LM for Pt's daughter, Ilene Qua.  CSW to continue to follow.  Providence Crosby, LCSWA Clinical Social Work [phone]

## 2012-02-19 NOTE — Progress Notes (Signed)
eLink Physician-Brief Progress Note Patient Name: Lynn Evans DOB: December 11, 1933 MRN: [id number]  Date of Service  02/19/2012   HPI/Events of Note   refsing CT chest. Autoimmune panel negative  eICU Interventions  D/w [facility] Dollene Cleveland who will discuss with patient. PCCM will see again 02/22/12   Intervention Category Minor Interventions: Other:  Trampus Mcquerry 02/19/2012, 4:02 PM

## 2012-02-19 NOTE — Discharge Instructions (Signed)
She may shower and get left hip incision wet just no tub baths. She is WBAT to left hip, THA precautions

## 2012-02-19 NOTE — Progress Notes (Signed)
Speech Language Pathology Dysphagia Treatment Patient Details Name: Lynn Evans MRN: [id number] DOB: Jan 23, 1934 Today's Date: 02/19/2012 Time: 2952-8413 SLP Time Calculation (min): 26 min  Assessment / Plan / Recommendation Clinical Impression  Pt observed with graham cracker today and orange juice without clinical s/s of aspiration. Pt continuing to bealch after intake today.    Pt states that she can drink orange juice without coughing - suspect the slight incr viscocity helpful.  Pt educated to reasoning behind use of thickener in drinks - to which she verbalized that she understood.  Pt acknowledges that she coughs when she drinks thin drinks but not thickened.  She further acknowledges recent admissions to hospital requiring intubation.  Pt agreeable to continue thickened liquids for maximal airway protection with her COPD - especially as she is continuing to improve.  Provided written education regarding Wilhemina Bonito Protocol for this pt to use when d/c hospital (with MD permission) - to prevent dehydration with maximal lung protection.  Pt appears to be tolerating current diet and expressed appreciation for her care.  SlP to follow up next week to assure pt education complete.  Do not recommend MBS at this time, as this will likely not change pt's outcomes - it will likely only document overt aspiration of thin with current recommendations standing.      Diet Recommendation  Continue with Current Diet: Nectar-thick liquid;Dysphagia 3 (mechanical soft)    SLP Plan Continue with current plan of care   Pertinent Vitals/Pain 3 liters of oxygen!, afebrile, decreased lung sounds - productive cough evidenced by secretion-filled tissues at bedside.   Swallowing Goals  SLP Swallowing Goals Swallow Study Goal #2 - Progress: Progressing toward goal  General Temperature Spikes Noted: No Respiratory Status: Supplemental O2 delivered via (comment) (now on 3 liters!) Oral Cavity - Dentition:  Adequate natural dentition Patient Positioning: Upright in bed  Oral Cavity - Oral Hygiene     Dysphagia Treatment Treatment focused on: Skilled observation of diet tolerance;Patient/family/caregiver education Treatment Methods/Modalities: Skilled observation Patient observed directly with PO's: Yes Type of PO's observed: Regular;Nectar-thick liquids Feeding: Able to feed self Liquids provided via: Straw Type of cueing: Verbal Amount of cueing: Minimal        Chales Abrahams 02/19/2012, 5:29 PM

## 2012-02-20 DIAGNOSIS — D7289 Other specified disorders of white blood cells: Secondary | ICD-10-CM

## 2012-02-20 DIAGNOSIS — I5032 Chronic diastolic (congestive) heart failure: Secondary | ICD-10-CM

## 2012-02-20 DIAGNOSIS — S72009A Fracture of unspecified part of neck of unspecified femur, initial encounter for closed fracture: Secondary | ICD-10-CM

## 2012-02-20 DIAGNOSIS — R000 Tachycardia, unspecified: Secondary | ICD-10-CM

## 2012-02-20 LAB — BASIC METABOLIC PANEL
CO2: 30 mEq/L (ref 19–32)
Chloride: 91 mEq/L — ABNORMAL LOW (ref 96–112)
Creatinine, Ser: 1.13 mg/dL — ABNORMAL HIGH (ref 0.50–1.10)
Potassium: 4.6 mEq/L (ref 3.5–5.1)
Sodium: 130 mEq/L — ABNORMAL LOW (ref 135–145)

## 2012-02-20 MED ORDER — BETHANECHOL CHLORIDE 10 MG PO TABS
10.0000 mg | ORAL_TABLET | Freq: Three times a day (TID) | ORAL | Status: DC
  Administered 2012-02-20 – 2012-02-21 (×3): 10 mg via ORAL
  Filled 2012-02-20 (×5): qty 1

## 2012-02-20 MED ORDER — POLYETHYLENE GLYCOL 3350 17 G PO PACK
17.0000 g | PACK | Freq: Every day | ORAL | Status: DC
  Administered 2012-02-20 – 2012-02-22 (×3): 17 g via ORAL
  Filled 2012-02-20 (×3): qty 1

## 2012-02-20 NOTE — Consult Note (Signed)
Urology Consult     History of Present Illness: 78 years old female S/P left hip hemiarthroplasty on 6/23.  Patient was in and out catheterized yesterday.  She has not voided since yesterday as per the nurses.  Thy have been unable to insert Foley.  I was asked for assistance with catheter insertion. With a finger in the vagina I was able to catheterize the urethral meatus.  I passed the catheter in the bladder.  About 100 ml of concentrated urine were drained out of the bladder.  Catheter was left to straight drainage.  Past Medical History  Diagnosis Date  . CHF (congestive heart failure)   . COPD (chronic obstructive pulmonary disease)   . Dysrhythmia   . Arthritis    Past Surgical History  Procedure Date  . Hemorrhoid surgery   . Hip arthroplasty 02/14/2012    Procedure: ARTHROPLASTY BIPOLAR HIP;  Surgeon: Senaida Lange, MD;  Location: [facility] ORS;  Service: Orthopedics;  Laterality: Left;    Medications: Amiodarone, Symbicort, Calcium carbonate, Diltiazem, Lasix, Levalbuterol Allergies: No Known Allergies  Family History  Problem Relation Age of Onset  . Colon cancer Mother   . Cirrhosis Father    Social History:  reports that she quit smoking about 3 months ago. Her smoking use included Cigarettes. She has a 57 pack-year smoking history. She has quit using smokeless tobacco. She reports that she does not drink alcohol or use illicit drugs.  ROS: All systems are reviewed and negative except as noted.  Physical Exam:  Vital signs in last 24 hours: Temp:  [97.8 F (36.6 C)-98 F (36.7 C)] 98 F (36.7 C) (06/29 0525) Pulse Rate:  [75-77] 75  (06/29 0525) Resp:  [18-20] 18  (06/29 0525) BP: (133-145)/(61-71) 145/71 mmHg (06/29 1025) SpO2:  [93 %-99 %] 97 % (06/29 1143)  Cardiovascular: Skin warm; not flushed Respiratory: Breaths quiet; no shortness of breath Abdomen: No masses Neurological: Normal sensation to touch Musculoskeletal: Normal motor function arms and  legs Lymphatics: No inguinal adenopathy Skin: No rashes Genitourinary:Difficult to identify the urethral meatus. Atrophic vagina.  Laboratory Data:  Results for orders placed during the hospital encounter of 02/13/12 (from the past 72 hour(s))  MAGNESIUM     Status: Normal   Collection Time   02/18/12  4:20 AM      Component Value Range Comment   Magnesium 1.9  1.5 - 2.5 mg/dL   CBC     Status: Abnormal   Collection Time   02/18/12  4:20 AM      Component Value Range Comment   WBC 10.6 (*) 4.0 - 10.5 K/uL    RBC 3.08 (*) 3.87 - 5.11 MIL/uL    Hemoglobin 8.9 (*) 12.0 - 15.0 g/dL    HCT 16.1 (*) 09.6 - 46.0 %    MCV 87.3  78.0 - 100.0 fL    MCH 28.9  26.0 - 34.0 pg    MCHC 33.1  30.0 - 36.0 g/dL    RDW 04.5 (*) 40.9 - 15.5 %    Platelets 216  150 - 400 K/uL   BASIC METABOLIC PANEL     Status: Abnormal   Collection Time   02/18/12  4:20 AM      Component Value Range Comment   Sodium 134 (*) 135 - 145 mEq/L    Potassium 3.3 (*) 3.5 - 5.1 mEq/L    Chloride 92 (*) 96 - 112 mEq/L    CO2 34 (*) 19 - 32 mEq/L  Glucose, Bld 118 (*) 70 - 99 mg/dL    BUN 19  6 - 23 mg/dL    Creatinine, Ser 2.44  0.50 - 1.10 mg/dL    Calcium 8.6  8.4 - 01.0 mg/dL    GFR calc non Af Amer 51 (*) >90 mL/min    GFR calc Af Amer 59 (*) >90 mL/min   ANA     Status: Normal   Collection Time   02/18/12  4:20 AM      Component Value Range Comment   ANA NEGATIVE  NEGATIVE   SEDIMENTATION RATE     Status: Abnormal   Collection Time   02/18/12  4:20 AM      Component Value Range Comment   Sed Rate 78 (*) 0 - 22 mm/hr   ANTI-SCLERODERMA ANTIBODY     Status: Normal   Collection Time   02/18/12  4:20 AM      Component Value Range Comment   Scleroderma (Scl-70) (ENA) Antibody, IgG <1  <30 AU/mL   SJOGRENS SYNDROME-A EXTRACTABLE NUCLEAR ANTIBODY     Status: Normal   Collection Time   02/18/12  4:20 AM      Component Value Range Comment   SSA (Ro) (ENA) Antibody, IgG 3  <30 AU/mL   SJOGRENS SYNDROME-B  EXTRACTABLE NUCLEAR ANTIBODY     Status: Normal   Collection Time   02/18/12  4:20 AM      Component Value Range Comment   SSB (La) (ENA) Antibody, IgG <1  <30 AU/mL   CYCLIC CITRUL PEPTIDE ANTIBODY, IGG     Status: Normal   Collection Time   02/18/12  4:20 AM      Component Value Range Comment   Cyclic Citrullin Peptide Ab <2.7  0.0 - 5.0 U/mL   CBC     Status: Abnormal   Collection Time   02/19/12  4:20 AM      Component Value Range Comment   WBC 9.7  4.0 - 10.5 K/uL    RBC 3.09 (*) 3.87 - 5.11 MIL/uL    Hemoglobin 8.9 (*) 12.0 - 15.0 g/dL    HCT 25.3 (*) 66.4 - 46.0 %    MCV 89.0  78.0 - 100.0 fL    MCH 28.8  26.0 - 34.0 pg    MCHC 32.4  30.0 - 36.0 g/dL    RDW 40.3 (*) 47.4 - 15.5 %    Platelets 213  150 - 400 K/uL   BASIC METABOLIC PANEL     Status: Abnormal   Collection Time   02/19/12  4:20 AM      Component Value Range Comment   Sodium 133 (*) 135 - 145 mEq/L    Potassium 4.1  3.5 - 5.1 mEq/L    Chloride 94 (*) 96 - 112 mEq/L    CO2 33 (*) 19 - 32 mEq/L    Glucose, Bld 104 (*) 70 - 99 mg/dL    BUN 22  6 - 23 mg/dL    Creatinine, Ser 2.59  0.50 - 1.10 mg/dL    Calcium 8.7  8.4 - 56.3 mg/dL    GFR calc non Af Amer 50 (*) >90 mL/min    GFR calc Af Amer 58 (*) >90 mL/min    Recent Results (from the past 240 hour(s))  URINE CULTURE     Status: Normal   Collection Time   02/14/12  6:33 AM      Component Value Range Status Comment   Specimen Description  URINE, CATHETERIZED   Final    Special Requests NONE   Final    Culture  Setup Time [id number]   Final    Colony Count NO GROWTH   Final    Culture NO GROWTH   Final    Report Status 02/15/2012 FINAL   Final   SURGICAL PCR SCREEN     Status: Normal   Collection Time   02/14/12  7:11 AM      Component Value Range Status Comment   MRSA, PCR NEGATIVE  NEGATIVE Final    Staphylococcus aureus NEGATIVE  NEGATIVE Final    Creatinine:  Basename 02/19/12 0420 02/18/12 0420 02/17/12 0415 02/16/12 0420 02/15/12 0413  02/14/12 0514 02/13/12 2155  CREATININE 1.05 1.04 1.11* 1.14* 1.22* 1.14* 1.19*      Impression/Assessment:  Urinary retention  Plan:  Leave Foley indwelling Check BUN and creatinine. Tremar Wickens-HENRY 02/20/2012, 1:56 PM

## 2012-02-20 NOTE — Progress Notes (Signed)
Subjective: 6 Days Post-Op Procedure(s) (LRB): ARTHROPLASTY BIPOLAR HIP (Left) Patient reports pain as mild.   Patient seen in rounds with Dr. Darrelyn Hillock. Patient is having problems with voiding. She had the foley removed yesterday and has been unable to void on her own. She had a successful in and out cath yesterday afternoon but the nurses have been unable to perform in and out cath and also having difficulty placing foley. Nurse in the room during rounds going to attempt. Patient reports that she is doing well otherwise. No complaints of shortness of breath.  Plan is to go Rehab after hospital stay.  Objective: Vital signs in last 24 hours: Temp:  [97.8 F (36.6 C)-98 F (36.7 C)] 98 F (36.7 C) (06/29 0525) Pulse Rate:  [71-77] 75  (06/29 0525) Resp:  [18-20] 18  (06/29 0525) BP: (133-164)/(61-67) 144/63 mmHg (06/29 0525) SpO2:  [93 %-97 %] 96 % (06/29 0525)  Intake/Output from previous day:  Intake/Output Summary (Last 24 hours) at 02/20/12 0821 Last data filed at 02/20/12 0100  Gross per 24 hour  Intake    510 ml  Output   1150 ml  Net   -640 ml    Intake/Output this shift:    Labs:  Basename 02/19/12 0420 02/18/12 0420  HGB 8.9* 8.9*    Basename 02/19/12 0420 02/18/12 0420  WBC 9.7 10.6*  RBC 3.09* 3.08*  HCT 27.5* 26.9*  PLT 213 216    Basename 02/19/12 0420 02/18/12 0420  NA 133* 134*  K 4.1 3.3*  CL 94* 92*  CO2 33* 34*  BUN 22 19  CREATININE 1.05 1.04  GLUCOSE 104* 118*  CALCIUM 8.7 8.6    EXAM General - Patient is Alert and Oriented Extremity - Neurologically intact Neurovascular intact Dorsiflexion/Plantar flexion intact Dressing/Incision - C/D/I Motor Function - intact, moving foot and toes well on exam.    Past Medical History  Diagnosis Date  . CHF (congestive heart failure)   . COPD (chronic obstructive pulmonary disease)   . Dysrhythmia   . Arthritis     Assessment/Plan: 6 Days Post-Op Procedure(s) (LRB): ARTHROPLASTY BIPOLAR  HIP (Left) Principal Problem:  *Fracture of femoral neck, left Active Problems:  COPD (chronic obstructive pulmonary disease)  Acute on chronic diastolic congestive heart failure  HTN (hypertension)  CKD (chronic kidney disease), stage III  Anemia of chronic disease  Hyponatremia  Atrial fibrillation  Acute respiratory failure with hypoxia  ILD (interstitial lung disease)   Advance diet Up with therapy DVT Prophylaxis - Lovenox Insert foley Bladder scan already performed last night Stable from ortho standpoint  Lilleigh Hechavarria LAUREN 02/20/2012, 8:21 AM

## 2012-02-20 NOTE — Progress Notes (Addendum)
PT Cancellation Note  Treatment cancelled today due to patient's refusal to participate stating "I have been a busy woman this morning and I don't want any physical therapy this morning." She agreed for [place] to check back with her this afternoon.  Marella Bile 02/20/2012, 12:32 PM  Also refused this p.m due to " I have had a lot going on today and just would like to wait until tomorrow.  She has been very busy with procedures, tests, etc. Would like for [place] to try in a.m tomorrow to hopeful ambulate again.  Marella Bile, PT Pager: [phone] 02/20/2012

## 2012-02-20 NOTE — Progress Notes (Signed)
Patient still unable to void, bladder scan >500, attempted to insert foley, unable to, other nurses including the OR nurse attempted as well but was not successful. Dr. Benjamine Mola notified and consulted urology. Dr Brunilda Payor informed, will be coming to see patient. Will continue to assess patient.

## 2012-02-20 NOTE — Progress Notes (Signed)
Patient bladder scan had a result of 350 cc. Patient has order to I&O cath but staff unable to perform procedure.  Patient is very swollen and left leg has limited movement.  Staff on previous shift also had difficulty performing I&O cath.  Several RNs have attempted on this shift.  MD on call was notified and suggested to use pediatric cath.  Explained that the size of the cath did not appear to be the  problem, but rather the patient's anatomy. Will let day shift RN attempt to I&O cath once they come on. Manson Passey, Claxton Levitz Cherie

## 2012-02-20 NOTE — Progress Notes (Signed)
[facility] PROGRESS NOTE  Lynn Evans YNW:[id number] DOB: [birth year] DOA: 02/13/2012 PCP: Ignatius Specking., MD  Assessment/Plan:  1. Mechanical fall causing Comminuted subcapital or basicervical left femoral neck fracture status post Left hip hemiarthroplasty #5 press fit stem, +0 neck, 44 uni head by Dr. supple on 02/14/2012 - patient will receive PT OT, wound care per orthopedics, patient has persistently refused any chemical prophylaxis as she says even 81 mg of aspirin makes her nose bleed, she was counseled about the risks of DVT PE death and disability however she continues to refuse any chemical DVT prophylaxis. We'll provide her with SCDs, Pain control for now for hip pain, Ortho following Dr Rennis Chris.   2. History of atrial fibrillation patient not on Coumadin at home. Plan will be rate control- home dose Cardizem and amiodarone will be continued   3. Leukocytosis with chest x-ray changes of acute versus chronic interstitial changes- patient received 24 hours of vancomycin and Zosyn, on 02/15/2012 since her numbers looked better and she felt good she was switched to oral Levaquin, however looks like patient could have aspirated again last night 24 hours into the hospital stay, right lower lobe infiltrate is worse, this could have been secondary to constipation and Ileus that she developed. Have requested speech to see her, aspiration precautions, feeding assistance, escallate ABX to IV Vanco-Mero and IV Levaquin, oxygen and nebulizer treatments, closely monitor continuous pulse oximetry.  Pulmonary following as well- recommeded CT scan of chest- patient still reluctant to have- oxygenation better (5L (on 2L at home))  4. Ileus: reglan ATC, had BM yesterday with enema, add stool softeners (no nausea/vomiting), limit pain meds- replace K+, check labs in AM- good BS today  5.Hypertension stable - continue home medications no acute issues.   6. Anemia of chronic disease with acute  decline. with perioperative blood loss - as post 2 units of packed RBC transfusion on 02/15/2012 followed by IV Lasix , followup H&H is stable.   7. Mild hyponatremia-  ?SIADH from surgery  8. Hypokalemia- repleat  9.  Urinary retention- try to replace foley- ask urolgy to see if nurses unable to get  DVT Prophylaxis - SCDs - patient has persistently refused any chemical prophylaxis as she says even 81 mg of aspirin makes her nose bleed, she was counseled about the risks of DVT PE death and disability however she continues to refuse any chemical DVT prophylaxis. Lovenox has been ordered if patient changes mind she will get it.   Procedures - Left hip hemiarthroplasty #5 press fit stem, +0 neck, 44 uni head by Dr. supple on 02/14/2012, soapsuds enema.   Consults - Ortho-S work, speech therapy.   Prognosis poor.   Plan to change diet: mechanical soft/ground meats for energy conservation and continue nectar liquids- aspiration precuations  Code Status: DNR  Disposition Plan: SNF vs H/H   Marlin Canary, D.O.  [facility] Pager [phone]  02/20/2012, 10:52 AM  LOS: 7 days    Interim History: 78 year old Caucasian female who was admitted with mechanical fall and left femoral neck fracture, she also has history of atrial fibrillation, patient does not take any anti-coagulation and even refuses prophylactic dose Lovenox as she says she bleeds very easily, was admitted with leukocytosis and questionable right lower lobe infiltrate, during her hospital stay she has developed ileus and has large stool burden in her colon, also her right lower lobe infiltrate seems to be getting worse with increased oxygen demand. Family later informed the patient was  recently admitted to [facility] intubated for acute respiratory failure due to COPD, CHF, questionable pneumonia.    Subjective: Breathing better C/o not being able to pee- nurses unable to cath Again, spoke with patient at length  about needing to get up and move and the risks of not doing it   Objective: Filed Vitals:   02/19/12 2041 02/20/12 0525 02/20/12 0920 02/20/12 1025  BP: 145/61 144/63  145/71  Pulse: 75 75    Temp: 98 F (36.7 C) 98 F (36.7 C)    TempSrc: Oral Oral    Resp: 19 18    Height:      Weight:      SpO2: 94% 96% 99%     Intake/Output Summary (Last 24 hours) at 02/20/12 1052 Last data filed at 02/20/12 0830  Gross per 24 hour  Intake    750 ml  Output   1150 ml  Net   -400 ml    Exam:  GEN: Awake Alert, Oriented *3, No new neurological deficits, ill appearing HEENT: [place].AT,PERRAL  Supple Neck,No JVD, No cervical lymphadenopathy appriciated.  Symmetrical Chest wall movement, Good air movement bilaterally, right basilar rales  RRR,No Gallops,Rubs or new Murmurs, No Parasternal Heave  + BS today, Abd Soft but distended, Non tender, No organomegaly appriciated, No rebound -guarding or rigidity.  No Cyanosis, Clubbing or edema,  No new Rash or bruise   Data Reviewed: Basic Metabolic Panel:  Lab 02/19/12 8295 02/18/12 0420 02/17/12 0415 02/16/12 0420 02/15/12 0413  NA 133* 134* 133* 132* 132*  K 4.1 3.3* -- -- --  CL 94* 92* 92* 93* 96  CO2 33* 34* 33* 29 30  GLUCOSE 104* 118* 105* 107* 95  BUN 22 19 19 22 19   CREATININE 1.05 1.04 1.11* 1.14* 1.22*  CALCIUM 8.7 8.6 8.2* 8.0* 8.0*  MG -- 1.9 1.8 -- --  PHOS -- -- -- -- --   Liver Function Tests:  Lab 02/13/12 2155 02/13/12 1655  AST 22 26  ALT 16 17  ALKPHOS 45 52  BILITOT 0.4 0.3  PROT 6.9 7.8  ALBUMIN 3.2* 3.6   No results found for this basename: LIPASE:5,AMYLASE:5 in the last 168 hours No results found for this basename: AMMONIA:5 in the last 168 hours CBC:  Lab 02/19/12 0420 02/18/12 0420 02/17/12 0415 02/16/12 0420 02/15/12 2049 02/13/12 2155 02/13/12 1655  WBC 9.7 10.6* 10.7* 13.0* 10.7* -- --  NEUTROABS -- -- -- -- -- 13.4* 21.7*  HGB 8.9* 8.9* 8.9* 9.8* 10.2* -- --  HCT 27.5* 26.9* 26.4* 28.9* 29.7* --  --  MCV 89.0 87.3 85.7 85.3 85.6 -- --  PLT 213 216 173 188 168 -- --   Cardiac Enzymes: No results found for this basename: CKTOTAL:5,CKMB:5,CKMBINDEX:5,TROPONINI:5 in the last 168 hours BNP: No components found with this basename: POCBNP:5 CBG: No results found for this basename: GLUCAP:5 in the last 168 hours  Recent Results (from the past 240 hour(s))  URINE CULTURE     Status: Normal   Collection Time   02/14/12  6:33 AM      Component Value Range Status Comment   Specimen Description URINE, CATHETERIZED   Final    Special Requests NONE   Final    Culture  Setup Time [id number]   Final    Colony Count NO GROWTH   Final    Culture NO GROWTH   Final    Report Status 02/15/2012 FINAL   Final   SURGICAL PCR SCREEN  Status: Normal   Collection Time   02/14/12  7:11 AM      Component Value Range Status Comment   MRSA, PCR NEGATIVE  NEGATIVE Final    Staphylococcus aureus NEGATIVE  NEGATIVE Final      Studies: Dg Chest 1 View  02/13/2012  *RADIOLOGY REPORT*  Clinical Data: Left femoral neck fracture.  Preoperative respiratory evaluation.  Former smoker with history of COPD and CHF.  CHEST - 1 VIEW  Comparison: None.  Findings: Cardiac silhouette enlarged.  Thoracic aorta atherosclerotic.  Hilar and mediastinal contours otherwise unremarkable.  Emphysematous changes throughout both lungs. Diffuse interstitial opacities which may represent chronic fibrosis, particularly at the right lung base.  No confluent airspace consolidation.  No visible pleural effusions.  IMPRESSION: Severe COPD/emphysema.  Cardiomegaly without pulmonary edema. Diffuse interstitial opacities which may be chronic and reflect interstitial fibrosis, particularly at the right lung base.  Original Report Authenticated By: Arnell Sieving, M.D.   Dg Hip 1 View Left  02/14/2012  *RADIOLOGY REPORT*  Clinical Data: Postop  LEFT HIP - 1 VIEW:  Comparison: Yesterday  Findings: Left hip hemiarthroplasty is in place.   Anatomic alignment of the osseous and prosthetic structures.  No breakage or loosening of the hardware.  IMPRESSION: Left hip hemiarthroplasty anatomically aligned.  Original Report Authenticated By: Donavan Burnet, M.D.   Dg Hip Complete Left  02/13/2012  *RADIOLOGY REPORT*  Clinical Data: Fall.  Left hip injury with pain.  LEFT HIP - COMPLETE 2+ VIEW  Comparison: None.  Findings: Comminuted subcapital or basicervical left femoral neck fracture.  Mild inferomedial joint space narrowing.  Osteopenia. No other visible fractures.  Included AP pelvis demonstrates symmetric mild joint space narrowing in the right hip.  No fractures elsewhere involving the bony pelvis.  Sacroiliac joints intact with degenerative changes. Symphysis pubis intact.  Iliofemoral atherosclerosis.  IMPRESSION: Comminuted subcapital or basicervical left femoral neck fracture.  Original Report Authenticated By: Arnell Sieving, M.D.   Dg Pelvis Portable  02/14/2012  *RADIOLOGY REPORT*  Clinical Data: Postop  PORTABLE PELVIS  Comparison: Yesterday  Findings: Left hip hemiarthroplasty is in place.  There is anatomic alignment of the osseous and prosthetic structures.  No breakage or loosening of the hardware.  IMPRESSION: Left hip hemiarthroplasty anatomically aligned.  Original Report Authenticated By: Donavan Burnet, M.D.   Dg Chest Port 1 View  02/17/2012  *RADIOLOGY REPORT*  Clinical Data: Pneumonia  PORTABLE CHEST - 1 VIEW  Comparison:   the previous day's study  Findings: Slight improvement in the lower lobe airspace opacities, right greater than left.  Mild interstitial infiltrates or edema persist.  Probable small pleural effusions.  Heart size upper limits normal.  Atheromatous aortic arch.  IMPRESSION:  1.  Slight improvement in asymmetric infiltrates with persistent small effusions.  Original Report Authenticated By: Osa Craver, M.D.   Dg Chest Port 1 View  02/16/2012  *RADIOLOGY REPORT*  Clinical Data: Cough.   Recent hip surgery.  PORTABLE CHEST - 1 VIEW  Comparison: 02/13/2012  Findings: Cardiac enlargement.  Pulmonary vascularity appears unremarkable. Emphysematous changes.  Prominent diffuse interstitial fibrosis throughout the lungs.  Developing superimposed airspace disease may be present in the right lower lung but increased density in this area could just be due to differences in technique.  No obvious blunting of costophrenic angles.  No pneumothorax.  Calcification of the aorta.  IMPRESSION: Emphysematous changes and diffuse parenchymal fibrosis.  Possible superimposed developing airspace infiltration or pneumonia in the right  lower lung.  Original Report Authenticated By: Marlon Pel, M.D.   Dg Abd Acute W/chest  02/16/2012  *RADIOLOGY REPORT*  Clinical Data: Abdominal pain, cough, shortness of breath  ACUTE ABDOMEN SERIES (ABDOMEN 2 VIEW & CHEST 1 VIEW)  Comparison: 02/13/2012  Findings: Patchy right mid/lower lung opacities, worrisome for multifocal pneumonia.  Underlying chronic interstitial markings/emphysematous changes. No pleural effusion or pneumothorax.  Cardiomediastinal silhouette is within normal limits.  Gaseous distension of the left colon.  Moderate stool in the right colon.  No disproportionate small bowel dilatation to suggest small bowel obstruction.  No evidence of free air on the lateral decubitus view.  Vascular calcifications.  Left total hip arthroplasty.  IMPRESSION: Patchy right mid/lower lung opacities, worrisome for multifocal pneumonia.  No evidence of bowel obstruction or free air.  Moderate stool in the right colon.  Gaseous distension of the left colon, possibly reflecting adynamic colonic ileus.  Original Report Authenticated By: Charline Bills, M.D.   Dg Abd Portable 1v  02/16/2012  *RADIOLOGY REPORT*  Clinical Data: Abdominal pain and distension.  PORTABLE ABDOMEN - 1 VIEW  Comparison: None.  Findings: Stool filled colon with mild dilatation of small and large  bowel most consistent with ileus.  Early obstruction is not excluded and follow-up is recommended if the patient's symptoms persist.  Calcifications projected over the pelvic region likely represent injection granulomas in the soft tissues.  Diffuse vascular calcification.  No radiopaque stones are appreciated. Degenerative changes in the spine.  Postoperative change in the left hip.  Scarring in the lung bases.  IMPRESSION: Nonspecific bowel gas pattern with mild small and large bowel prominence, likely representing ileus.  Original Report Authenticated By: Marlon Pel, M.D.    Scheduled Meds:    . albuterol  2.5 mg Nebulization TID  . amiodarone  200 mg Oral Daily  . bisacodyl  10 mg Rectal Once  . budesonide-formoterol  2 puff Inhalation BID  . diltiazem  180 mg Oral Daily  . enoxaparin  40 mg Subcutaneous Q24H  . feeding supplement  237 mL Oral Q24H  . furosemide  40 mg Oral Daily  . levofloxacin (LEVAQUIN) IV  750 mg Intravenous Q48H  . metoCLOPramide (REGLAN) injection  5 mg Intravenous Q8H  . tiotropium  18 mcg Inhalation Daily  . DISCONTD: albuterol  2.5 mg Nebulization QID   Continuous Infusions:

## 2012-02-21 DIAGNOSIS — D7289 Other specified disorders of white blood cells: Secondary | ICD-10-CM

## 2012-02-21 DIAGNOSIS — S72009A Fracture of unspecified part of neck of unspecified femur, initial encounter for closed fracture: Secondary | ICD-10-CM

## 2012-02-21 DIAGNOSIS — I5032 Chronic diastolic (congestive) heart failure: Secondary | ICD-10-CM

## 2012-02-21 DIAGNOSIS — R000 Tachycardia, unspecified: Secondary | ICD-10-CM

## 2012-02-21 NOTE — Progress Notes (Signed)
[facility] PROGRESS NOTE  Lynn Evans XBJ:[id number] DOB: 12/18/1933 DOA: 02/13/2012 PCP: Ignatius Specking., MD  Assessment/Plan:  1. Mechanical fall causing Comminuted subcapital or basicervical left femoral neck fracture status post Left hip hemiarthroplasty #5 press fit stem, +0 neck, 44 uni head by Dr. supple on 02/14/2012 - patient will receive PT OT, wound care per orthopedics, patient has persistently refused any chemical prophylaxis as she says even 81 mg of aspirin makes her nose bleed, she was counseled about the risks of DVT PE death and disability however she continues to refuse any chemical DVT prophylaxis. We'll provide her with SCDs, Pain control for now for hip pain, Ortho following Dr Rennis Chris.   2. History of atrial fibrillation patient not on Coumadin at home. Plan will be rate control- home dose Cardizem and amiodarone will be continued   3. Leukocytosis with chest x-ray changes of acute versus chronic interstitial changes- patient received 24 hours of vancomycin and Zosyn, on 02/15/2012 since her numbers looked better and she felt good she was switched to oral Levaquin, however looks like patient could have aspirated again last night 24 hours into the hospital stay, right lower lobe infiltrate is worse, this could have been secondary to constipation and Ileus that she developed. Have requested speech to see her, aspiration precautions, feeding assistance, escallate ABX to IV Vanco-Mero and IV Levaquin, oxygen and nebulizer treatments, closely monitor continuous pulse oximetry.  Pulmonary following as well- recommeded CT scan of chest- patient still reluctant to have- oxygenation better (3L (on 2L at home))  4. Ileus: reglan ATC, had BM yesterday with enema, add stool softeners (no nausea/vomiting), limit pain meds- replace K+, check labs in AM- good BS today  5.Hypertension stable - continue home medications no acute issues.   6. Anemia of chronic disease with acute  decline. with perioperative blood loss - as post 2 units of packed RBC transfusion on 02/15/2012 followed by IV Lasix , followup H&H is stable.   7. Mild hyponatremia-  ?SIADH from surgery  8. Hypokalemia- repleat  9.  Urinary retention- replaced foley- will leave in  DVT Prophylaxis - SCDs - patient has persistently refused any chemical prophylaxis as she says even 81 mg of aspirin makes her nose bleed, she was counseled about the risks of DVT PE death and disability however she continues to refuse any chemical DVT prophylaxis. Lovenox has been ordered if patient changes mind she will get it.   Procedures - Left hip hemiarthroplasty #5 press fit stem, +0 neck, 44 uni head by Dr. supple on 02/14/2012, soapsuds enema.   Consults - Ortho-S work, speech therapy.   Prognosis poor- will get patient up in chair today, asked her not to refuse PT any more as she will ge tworse if she is not moving  Plan to change diet: mechanical soft/ground meats for energy conservation and continue nectar liquids- aspiration precuations  Code Status: DNR  Disposition Plan: SNF    Marlin Canary, D.O.  [facility] Pager [phone]  02/21/2012, 10:19 AM  LOS: 8 days    Interim History: 78 year old Caucasian female who was admitted with mechanical fall and left femoral neck fracture, she also has history of atrial fibrillation, patient does not take any anti-coagulation and even refuses prophylactic dose Lovenox as she says she bleeds very easily, was admitted with leukocytosis and questionable right lower lobe infiltrate, during her hospital stay she has developed ileus and has large stool burden in her colon, also her right lower lobe infiltrate  seems to be getting worse with increased oxygen demand. Family later informed the patient was recently admitted to [facility] intubated for acute respiratory failure due to COPD, CHF, questionable pneumonia.    Subjective: Breathing better C/o not  being able to pee- nurses unable to cath Again, spoke with patient at length about needing to get up and move and the risks of not doing it   Objective: Filed Vitals:   02/20/12 2155 02/21/12 0543 02/21/12 0930 02/21/12 0949  BP: 147/66 147/64  149/62  Pulse: 73 70    Temp: 98.2 F (36.8 C) 98.4 F (36.9 C)    TempSrc: Oral Oral    Resp: 17 18    Height:      Weight:  47.4 kg (104 lb 8 oz)    SpO2: 95% 95% 91%     Intake/Output Summary (Last 24 hours) at 02/21/12 1019 Last data filed at 02/21/12 0543  Gross per 24 hour  Intake    240 ml  Output   1350 ml  Net  -1110 ml    Exam:  GEN: Awake Alert, Oriented *3, No new neurological deficits, ill appearing HEENT: [place].AT,PERRAL  Supple Neck,No JVD, No cervical lymphadenopathy appriciated.  Symmetrical Chest wall movement, Good air movement bilaterally, right basilar rales  RRR,No Gallops,Rubs or new Murmurs, No Parasternal Heave  + BS today, Abd Soft but distended, Non tender, No organomegaly appriciated, No rebound -guarding or rigidity.  No Cyanosis, Clubbing or edema,  No new Rash or bruise   Data Reviewed: Basic Metabolic Panel:  Lab 02/20/12 1191 02/19/12 0420 02/18/12 0420 02/17/12 0415 02/16/12 0420  NA 130* 133* 134* 133* 132*  K 4.6 4.1 -- -- --  CL 91* 94* 92* 92* 93*  CO2 30 33* 34* 33* 29  GLUCOSE 134* 104* 118* 105* 107*  BUN 30* 22 19 19 22   CREATININE 1.13* 1.05 1.04 1.11* 1.14*  CALCIUM 8.8 8.7 8.6 8.2* 8.0*  MG -- -- 1.9 1.8 --  PHOS -- -- -- -- --   Liver Function Tests: No results found for this basename: AST:5,ALT:5,ALKPHOS:5,BILITOT:5,PROT:5,ALBUMIN:5 in the last 168 hours No results found for this basename: LIPASE:5,AMYLASE:5 in the last 168 hours No results found for this basename: AMMONIA:5 in the last 168 hours CBC:  Lab 02/19/12 0420 02/18/12 0420 02/17/12 0415 02/16/12 0420 02/15/12 2049  WBC 9.7 10.6* 10.7* 13.0* 10.7*  NEUTROABS -- -- -- -- --  HGB 8.9* 8.9* 8.9* 9.8* 10.2*  HCT  27.5* 26.9* 26.4* 28.9* 29.7*  MCV 89.0 87.3 85.7 85.3 85.6  PLT 213 216 173 188 168   Cardiac Enzymes: No results found for this basename: CKTOTAL:5,CKMB:5,CKMBINDEX:5,TROPONINI:5 in the last 168 hours BNP: No components found with this basename: POCBNP:5 CBG: No results found for this basename: GLUCAP:5 in the last 168 hours  Recent Results (from the past 240 hour(s))  URINE CULTURE     Status: Normal   Collection Time   02/14/12  6:33 AM      Component Value Range Status Comment   Specimen Description URINE, CATHETERIZED   Final    Special Requests NONE   Final    Culture  Setup Time [id number]   Final    Colony Count NO GROWTH   Final    Culture NO GROWTH   Final    Report Status 02/15/2012 FINAL   Final   SURGICAL PCR SCREEN     Status: Normal   Collection Time   02/14/12  7:11 AM  Component Value Range Status Comment   MRSA, PCR NEGATIVE  NEGATIVE Final    Staphylococcus aureus NEGATIVE  NEGATIVE Final      Studies: Dg Chest 1 View  02/13/2012  *RADIOLOGY REPORT*  Clinical Data: Left femoral neck fracture.  Preoperative respiratory evaluation.  Former smoker with history of COPD and CHF.  CHEST - 1 VIEW  Comparison: None.  Findings: Cardiac silhouette enlarged.  Thoracic aorta atherosclerotic.  Hilar and mediastinal contours otherwise unremarkable.  Emphysematous changes throughout both lungs. Diffuse interstitial opacities which may represent chronic fibrosis, particularly at the right lung base.  No confluent airspace consolidation.  No visible pleural effusions.  IMPRESSION: Severe COPD/emphysema.  Cardiomegaly without pulmonary edema. Diffuse interstitial opacities which may be chronic and reflect interstitial fibrosis, particularly at the right lung base.  Original Report Authenticated By: Arnell Sieving, M.D.   Dg Hip 1 View Left  02/14/2012  *RADIOLOGY REPORT*  Clinical Data: Postop  LEFT HIP - 1 VIEW:  Comparison: Yesterday  Findings: Left hip hemiarthroplasty  is in place.  Anatomic alignment of the osseous and prosthetic structures.  No breakage or loosening of the hardware.  IMPRESSION: Left hip hemiarthroplasty anatomically aligned.  Original Report Authenticated By: Donavan Burnet, M.D.   Dg Hip Complete Left  02/13/2012  *RADIOLOGY REPORT*  Clinical Data: Fall.  Left hip injury with pain.  LEFT HIP - COMPLETE 2+ VIEW  Comparison: None.  Findings: Comminuted subcapital or basicervical left femoral neck fracture.  Mild inferomedial joint space narrowing.  Osteopenia. No other visible fractures.  Included AP pelvis demonstrates symmetric mild joint space narrowing in the right hip.  No fractures elsewhere involving the bony pelvis.  Sacroiliac joints intact with degenerative changes. Symphysis pubis intact.  Iliofemoral atherosclerosis.  IMPRESSION: Comminuted subcapital or basicervical left femoral neck fracture.  Original Report Authenticated By: Arnell Sieving, M.D.   Dg Pelvis Portable  02/14/2012  *RADIOLOGY REPORT*  Clinical Data: Postop  PORTABLE PELVIS  Comparison: Yesterday  Findings: Left hip hemiarthroplasty is in place.  There is anatomic alignment of the osseous and prosthetic structures.  No breakage or loosening of the hardware.  IMPRESSION: Left hip hemiarthroplasty anatomically aligned.  Original Report Authenticated By: Donavan Burnet, M.D.   Dg Chest Port 1 View  02/17/2012  *RADIOLOGY REPORT*  Clinical Data: Pneumonia  PORTABLE CHEST - 1 VIEW  Comparison:   the previous day's study  Findings: Slight improvement in the lower lobe airspace opacities, right greater than left.  Mild interstitial infiltrates or edema persist.  Probable small pleural effusions.  Heart size upper limits normal.  Atheromatous aortic arch.  IMPRESSION:  1.  Slight improvement in asymmetric infiltrates with persistent small effusions.  Original Report Authenticated By: Osa Craver, M.D.   Dg Chest Port 1 View  02/16/2012  *RADIOLOGY REPORT*  Clinical  Data: Cough.  Recent hip surgery.  PORTABLE CHEST - 1 VIEW  Comparison: 02/13/2012  Findings: Cardiac enlargement.  Pulmonary vascularity appears unremarkable. Emphysematous changes.  Prominent diffuse interstitial fibrosis throughout the lungs.  Developing superimposed airspace disease may be present in the right lower lung but increased density in this area could just be due to differences in technique.  No obvious blunting of costophrenic angles.  No pneumothorax.  Calcification of the aorta.  IMPRESSION: Emphysematous changes and diffuse parenchymal fibrosis.  Possible superimposed developing airspace infiltration or pneumonia in the right lower lung.  Original Report Authenticated By: Marlon Pel, M.D.   Dg Abd Acute W/chest  02/16/2012  *RADIOLOGY REPORT*  Clinical Data: Abdominal pain, cough, shortness of breath  ACUTE ABDOMEN SERIES (ABDOMEN 2 VIEW & CHEST 1 VIEW)  Comparison: 02/13/2012  Findings: Patchy right mid/lower lung opacities, worrisome for multifocal pneumonia.  Underlying chronic interstitial markings/emphysematous changes. No pleural effusion or pneumothorax.  Cardiomediastinal silhouette is within normal limits.  Gaseous distension of the left colon.  Moderate stool in the right colon.  No disproportionate small bowel dilatation to suggest small bowel obstruction.  No evidence of free air on the lateral decubitus view.  Vascular calcifications.  Left total hip arthroplasty.  IMPRESSION: Patchy right mid/lower lung opacities, worrisome for multifocal pneumonia.  No evidence of bowel obstruction or free air.  Moderate stool in the right colon.  Gaseous distension of the left colon, possibly reflecting adynamic colonic ileus.  Original Report Authenticated By: Charline Bills, M.D.   Dg Abd Portable 1v  02/16/2012  *RADIOLOGY REPORT*  Clinical Data: Abdominal pain and distension.  PORTABLE ABDOMEN - 1 VIEW  Comparison: None.  Findings: Stool filled colon with mild dilatation of small  and large bowel most consistent with ileus.  Early obstruction is not excluded and follow-up is recommended if the patient's symptoms persist.  Calcifications projected over the pelvic region likely represent injection granulomas in the soft tissues.  Diffuse vascular calcification.  No radiopaque stones are appreciated. Degenerative changes in the spine.  Postoperative change in the left hip.  Scarring in the lung bases.  IMPRESSION: Nonspecific bowel gas pattern with mild small and large bowel prominence, likely representing ileus.  Original Report Authenticated By: Marlon Pel, M.D.    Scheduled Meds:    . albuterol  2.5 mg Nebulization TID  . amiodarone  200 mg Oral Daily  . bethanechol  10 mg Oral TID  . bisacodyl  10 mg Rectal Once  . budesonide-formoterol  2 puff Inhalation BID  . diltiazem  180 mg Oral Daily  . enoxaparin  40 mg Subcutaneous Q24H  . feeding supplement  237 mL Oral Q24H  . furosemide  40 mg Oral Daily  . levofloxacin (LEVAQUIN) IV  750 mg Intravenous Q48H  . metoCLOPramide (REGLAN) injection  5 mg Intravenous Q8H  . polyethylene glycol  17 g Oral Daily  . tiotropium  18 mcg Inhalation Daily   Continuous Infusions:

## 2012-02-22 ENCOUNTER — Inpatient Hospital Stay (HOSPITAL_COMMUNITY): Payer: Medicare Other

## 2012-02-22 ENCOUNTER — Inpatient Hospital Stay
Admission: RE | Admit: 2012-02-22 | Discharge: 2012-05-06 | Disposition: A | Discharge status: Home / Self Care | Payer: No Typology Code available for payment source | Source: Ambulatory Visit | Attending: Internal Medicine | Admitting: Internal Medicine

## 2012-02-22 ENCOUNTER — Inpatient Hospital Stay: Payer: Medicare Other | Admitting: Adult Health

## 2012-02-22 DIAGNOSIS — I82409 Acute embolism and thrombosis of unspecified deep veins of unspecified lower extremity: Secondary | ICD-10-CM

## 2012-02-22 DIAGNOSIS — S72009A Fracture of unspecified part of neck of unspecified femur, initial encounter for closed fracture: Secondary | ICD-10-CM

## 2012-02-22 DIAGNOSIS — R140 Abdominal distension (gaseous): Secondary | ICD-10-CM

## 2012-02-22 DIAGNOSIS — R109 Unspecified abdominal pain: Principal | ICD-10-CM

## 2012-02-22 DIAGNOSIS — Q6210 Congenital occlusion of ureter, unspecified: Secondary | ICD-10-CM

## 2012-02-22 LAB — CBC
MCH: 28.5 pg (ref 26.0–34.0)
MCHC: 31.7 g/dL (ref 30.0–36.0)
MCV: 90 fL (ref 78.0–100.0)
Platelets: 394 10*3/uL (ref 150–400)
RDW: 16.8 % — ABNORMAL HIGH (ref 11.5–15.5)
WBC: 12.5 10*3/uL — ABNORMAL HIGH (ref 4.0–10.5)

## 2012-02-22 MED ORDER — HYDROCODONE-ACETAMINOPHEN 5-325 MG PO TABS: 1.0000 | ORAL_TABLET | Freq: Four times a day (QID) | ORAL | Status: AC | PRN

## 2012-02-22 MED ORDER — ACETAMINOPHEN 325 MG PO TABS: 650.0000 mg | ORAL_TABLET | Freq: Four times a day (QID) | ORAL | Status: DC | PRN

## 2012-02-22 MED ORDER — FOOD THICKENER (THICKENUP CLEAR): 1.0000 | ORAL | Status: DC | PRN

## 2012-02-22 MED ORDER — LEVOFLOXACIN 750 MG PO TABS
750.0000 mg | ORAL_TABLET | Freq: Every day | ORAL | Status: DC
  Filled 2012-02-22: qty 1

## 2012-02-22 MED ORDER — BISACODYL 10 MG RE SUPP: 10.0000 mg | Freq: Once | RECTAL | Status: AC

## 2012-02-22 MED ORDER — PANTOPRAZOLE SODIUM 40 MG PO TBEC: 40.0000 mg | DELAYED_RELEASE_TABLET | Freq: Two times a day (BID) | ORAL | Status: DC

## 2012-02-22 MED ORDER — METOCLOPRAMIDE HCL 5 MG PO TABS: 5.0000 mg | ORAL_TABLET | Freq: Four times a day (QID) | ORAL | Status: DC

## 2012-02-22 MED ORDER — ALBUTEROL SULFATE (5 MG/ML) 0.5% IN NEBU: 2.5000 mg | INHALATION_SOLUTION | Freq: Four times a day (QID) | RESPIRATORY_TRACT | Status: DC | PRN

## 2012-02-22 MED ORDER — LEVOFLOXACIN 750 MG PO TABS
750.0000 mg | ORAL_TABLET | ORAL | Status: DC
  Administered 2012-02-22: 750 mg via ORAL
  Filled 2012-02-22: qty 1

## 2012-02-22 MED ORDER — LEVOFLOXACIN 750 MG PO TABS: 750.0000 mg | ORAL_TABLET | Freq: Every day | ORAL | Status: AC

## 2012-02-22 MED ORDER — LEVOFLOXACIN 750 MG PO TABS: 750.0000 mg | ORAL_TABLET | ORAL | Status: AC

## 2012-02-22 MED ORDER — ENSURE COMPLETE PO LIQD: 237.0000 mL | ORAL | Status: DC

## 2012-02-22 MED ORDER — MENTHOL 3 MG MT LOZG: 1.0000 | LOZENGE | OROMUCOSAL | Status: DC | PRN

## 2012-02-22 MED ORDER — POLYETHYLENE GLYCOL 3350 17 G PO PACK: 17.0000 g | PACK | Freq: Every day | ORAL | Status: AC

## 2012-02-22 NOTE — Progress Notes (Signed)
Name: Lynn Evans MRN: [id number] DOB: 1933/12/23    LOS: 9  [place] [facility] / Critical Care Note   History of Present Illness: 78 y/o F, former smoker, with PMH of chronic diastolic CHF, COPD on home O2, Arthritis, atrial fibrillation (not on coumadin) admitted to [facility] on 6/22 s/p fall after tripping over her oxygen tubing with subsequent pain in her left hip and inability to bear weight.  Evaluation demonstrated a questionable RLL infiltrate and leukocytosis.  Hospital course complicated by ileus with large stool burden in colon.  PCCM consulted for increasing O2 needs and increase in size of RLL infiltrate.    She had recent admit to [facility] from 4/25 - 4/30 for hypercarbic respiratory failure requiring intubation, new onset atrial fibrillation, acute renal insufficiency and pulmonary HTN.      Cultures: 6/23 UC>>>neg 6/23 MRSA PCR>>>neg  Antibiotics: Levaquin 6/24>>> Meropenem 6/25> 6/25 Zosyn 6/22> 6/23   Tests / Events: 6/22 - Admit s/p fall with L hip fracture 6/23 - L Hemiarthroplasty 6/25 - increasing O2 needs, increased RLL infiltrate.  Speech eval (hx of known dysphagia since intubation 4/13).  Nectar thick liquids recommended    Vital Signs: Temp:  [98.2 F (36.8 C)-98.9 F (37.2 C)] 98.9 F (37.2 C) (07/01 0502) Pulse Rate:  [71-75] 75  (07/01 0502) Resp:  [16-17] 17  (07/01 0502) BP: (149-162)/(49-61) 162/49 mmHg (07/01 0502) SpO2:  [90 %-96 %] 90 % (07/01 1233) Weight:  [106 lb 4.2 oz (48.2 kg)] 106 lb 4.2 oz (48.2 kg) (07/01 0502) I/O last 3 completed shifts: In: 600 [P.O.:600] Out: 1300 [Urine:1300] O2 - 2lpm NP  Physical Examination: General: chronically ill elderly female in NAD Neuro: AAOx4, spirited, MAE spontaneously CV: s1s2 rrr, no m/r/g PULM: resp's even/non-labored , decreased bs basesclear GI: round /soft, decreased bsx4 Extremities: warm/dry, lower extremity edema L>R (left sided surgery)        Labs    CBC  Lab 02/22/12 1145  02/19/12 0420 02/18/12 0420  HGB 10.8* 8.9* 8.9*  HCT 34.1* 27.5* 26.9*  WBC 12.5* 9.7 10.6*  PLT 394 213 216     BMET  Lab 02/20/12 1458 02/19/12 0420 02/18/12 0420 02/17/12 0415 02/16/12 0420  NA 130* 133* 134* 133* 132*  K 4.6 4.1 -- -- --  CL 91* 94* 92* 92* 93*  CO2 30 33* 34* 33* 29  GLUCOSE 134* 104* 118* 105* 107*  BUN 30* 22 19 19 22   CREATININE 1.13* 1.05 1.04 1.11* 1.14*  CALCIUM 8.8 8.7 8.6 8.2* 8.0*  MG -- -- 1.9 1.8 --  PHOS -- -- -- -- --    No results found for this basename: INR:5 in the last 168 hours  No results found for this basename: PHART:5,PCO2:5,PCO2ART:5,PO2:5,PO2ART:5,HCO3:5,TCO2:5,O2SAT:5 in the last 168 hours   Radiology: 6/25 CXR:  Emphysematous changes and diffuse parenchymal fibrosis. Possible superimposed developing airspace infiltration or pneumonia in the right lower lung. Dg Chest Port 1 View  02/22/2012  *RADIOLOGY REPORT*  Clinical Data: Worsening cough  PORTABLE CHEST - 1 VIEW  Comparison: Portable exam 1118 hours compared to 02/17/2012  Findings: Enlargement of cardiac silhouette. Calcified tortuous aorta. Emphysematous and chronic bronchitic changes. Persistent interstitial infiltrates are seen in both lungs, right greater than left, though these appear improved in the lower right chest versus the previous exam. No new areas of consolidation, pleural effusion or pneumothorax. Osseous demineralization.  IMPRESSION: Enlargement of cardiac silhouette. Changes of COPD with persistent bilateral interstitial changes throughout both lungs, likely representing combination of  chronic interstitial lung disease/fibrosis with acute superimposed infiltrate in the lower right lung. Aeration in the lower right lung appears slightly improved since previous exam.  Original Report Authenticated By: Lollie Marrow, M.D.      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  Concern for RLL infiltrate with worsening 6/25.  Pink tinged sputum likely related to recent nose bleeds with aspiration of blood / recent intubation for surgery / and irritant cough.   On amiodarone since April but not likely source of changes noted on cxr. She has known history of dysphagia since her admit in 4/13.  ST eval concerning for current evidence of aspiration.    PULMONARY 12/19/11 ECHO>>>Pulmonary arteries: PA peak pressure: 50mm Hg (S).  EF 55-60%    RLL Airspace disease / Fibrotic changes on CXR COPD Pulmonary HTN (by ECHO) Dysphagia Afib - rate controlled  CXR improved with lasix, PCT negative and BNP elevated.  Likely component of volume overload / CHF.    Plan: -lasix as renal fxn and BP permit - additional doses 6/25, 6/25 -continue spiriva, albuterol, symbicort   -wean O2 to keep saturations 88-94% -minimize narcotics / sedating medications as able to reduce atx -narrow abx as PCT negative, elevated BNP and improvement in CXR with lasix -pccm signing off 7/1  All other medical problems per [facility].      Brett Canales Minor ACNP [facility] Pager [phone] till 3 pm If no answer page [phone] 02/22/2012, 1:23 PM  Pt independently  seen and examined and available cxr's reviewed and I agree with above findings/ imp/ plan  NB  Impossible to say for sure what role if any amiodarone is having in her pulmonary infiltrates - even an open lung bx cannot distinguish amio toxicity from any of a number of non-specific patterns of  lung injury/ acute and chronic, so I would prefer in this setting to use something else if feasible like multaq until a definite clinical plateau is reached and then ok to rechallenge if amio really felt to be a better choice  RE PF in general: Use of PPI is associated with improved survival time and with decreased radiologic fibrosis per King's study published in AJRCCM vol 184 p1390.  Dec 2011  This may not be cause and effect, but given how universally unhelpful all the otherstudy drugs have been for pf,   rec max gerd rx here.  Sandrea Hughs, MD Pulmonary and Critical Care Medicine [facility] Cell [phone]

## 2012-02-22 NOTE — Discharge Summary (Signed)
Patient seen and examined by me.  Agree with plan to D/C .  Patient passing a lot of gas- but no BM today- may require enema's to help until she will get up and move around.  Says she usually eats fresh food and does not like the food here.  Patient has an overall poor prognosis- aspiration risk and not motivated to do what she needs to do to get better.  Has some form of interstitial lung dz but refused CT scan for further work up.  Will need close monitoring.  Had urinary retention- will ned a voiding trial at some point soon.    Lynn Evans

## 2012-02-22 NOTE — Progress Notes (Signed)
Patient is set to discharge to [facility] today. Patient aware. [facility] called for transport.   Unice Bailey, LCSWA [phone]

## 2012-02-22 NOTE — Progress Notes (Signed)
SLP Cancellation Note  Treatment cancelled today due to pt discharging currently.  SLP spoke to MD earlier regarding pt's chronic dysphagia and aspiration even prior to this admission (at least since April 2013 hospitalization/intubation).    Rec follow up SLP at SNF for dysphagia management and incorporation of frazier water protocol to maximize hydration, QOL and compliance.  Thanks.  Donavan Burnet, MS [facility] SLP [phone]

## 2012-02-22 NOTE — Progress Notes (Signed)
Physical Therapy Treatment Patient Details Name: Lynn Evans MRN: [id number] DOB: 1934-07-11 Today's Date: 02/22/2012 Time: 4696-2952 PT Time Calculation (min): 20 min  PT Assessment / Plan / Recommendation Comments on Treatment Session  Pt ambulated 12 feet today and continues to require +2 assist.  Pt also encouraged to perform ankle pumps and quad sets throughout the day as tolerated.    Follow Up Recommendations  Skilled nursing facility;Supervision/Assistance - 24 hour    Barriers to Discharge        Equipment Recommendations  Defer to next venue    Recommendations for Other Services    Frequency     Plan Discharge plan remains appropriate;Frequency remains appropriate    Precautions / Restrictions Precautions Precautions: Posterior Hip;Fall Restrictions LLE Weight Bearing: Weight bearing as tolerated   Pertinent Vitals/Pain 4/10 pain L hip, premedicated, repositioned    Mobility  Bed Mobility Bed Mobility: Supine to Sit;Scooting to HOB Supine to Sit: With rails;3: Mod assist Scooting to [facility]: 2: Max assist Details for Bed Mobility Assistance: assist for L LE and trunk, verbal cues for technique Transfers Transfers: Sit to Stand;Stand to Sit Sit to Stand: 1: +2 Total assist;From bed;With upper extremity assist Sit to Stand: Patient Percentage: 50% Stand to Sit: 1: +2 Total assist;To chair/3-in-1 Stand to Sit: Patient Percentage: 50% Details for Transfer Assistance: verbal cues for safe technique, pt did not reach back for armrest upon sitting so controlled descent manually, pt requiring a little more assist today than previous visit Ambulation/Gait Ambulation/Gait Assistance: 1: +2 Total assist Ambulation/Gait: Patient Percentage: 50% Ambulation Distance (Feet): 12 Feet Assistive device: Rolling walker Ambulation/Gait Assistance Details: pt required increased encouraged to ambulate with assist, multimodal cues for technique and posture, manual assist of moving RW,  maintained oxygen and increased to 6L due to drop to 80% with ambulation however quickly raised to 98% upon sitting in recliner (placed back on 4L at 93% upon leaving room), heavy lean to right side  Gait Pattern: Step-to pattern;Decreased stance time - left;Antalgic;Trunk flexed;Lateral trunk lean to right Gait velocity: decreased    Exercises     PT Diagnosis:    PT Problem List:   PT Treatment Interventions:     PT Goals Acute Rehab PT Goals PT Goal Formulation: With patient Time For Goal Achievement: 02/29/12 (goals udated due to expiration) Potential to Achieve Goals: Fair Pt will go Supine/Side to Sit: with min assist PT Goal: Supine/Side to Sit - Progress: Goal set today Pt will go Sit to Supine/Side: with min assist PT Goal: Sit to Supine/Side - Progress: Goal set today Pt will go Sit to Stand: with min assist PT Goal: Sit to Stand - Progress: Goal set today Pt will go Stand to Sit: with min assist PT Goal: Stand to Sit - Progress: Goal set today Pt will Transfer Bed to Chair/Chair to Bed: with min assist PT Transfer Goal: Bed to Chair/Chair to Bed - Progress: Goal set today Pt will Ambulate: 16 - 50 feet;with min assist;with least restrictive assistive device PT Goal: Ambulate - Progress: Goal set today  Visit Information  Last PT Received On: 02/22/12 Assistance Needed: +2    Subjective Data  Subjective: "I had a rough weekend."   Cognition  Overall Cognitive Status: Appears within functional limits for tasks assessed/performed    Balance     End of Session PT - End of Session Equipment Utilized During Treatment: Gait belt;Oxygen Activity Tolerance: Patient limited by fatigue;Patient limited by pain Patient left: in chair;with call bell/phone  within reach   GP     [facility] E 02/22/2012, 11:33 AM Pager: [phone]

## 2012-02-22 NOTE — Discharge Summary (Signed)
Physician Discharge Summary  Patient ID: Lynn Evans MRN: [id number] DOB/AGE: Sep 09, 1933 78 y.o.  Admit date: 02/13/2012 Discharge date: 02/22/2012  Primary Care Physician:  Ignatius Specking., MD   Discharge Diagnoses:    Principal Problem:  *Fracture of femoral neck, left Active Problems:  COPD (chronic obstructive pulmonary disease)  Acute on chronic diastolic congestive heart failure  HTN (hypertension)  CKD (chronic kidney disease), stage III  Anemia of chronic disease  Hyponatremia  Atrial fibrillation  Acute respiratory failure with hypoxia  ILD (interstitial lung disease)   Medication List  As of 02/22/2012  1:04 PM   TAKE these medications         acetaminophen 325 MG tablet   Commonly known as: TYLENOL   Take 2 tablets (650 mg total) by mouth every 6 (six) hours as needed (or Fever >/= 101).      albuterol (5 MG/ML) 0.5% nebulizer solution   Commonly known as: PROVENTIL   Take 0.5 mLs (2.5 mg total) by nebulization every 6 (six) hours as needed for wheezing or shortness of breath.      amiodarone 200 MG tablet   Commonly known as: PACERONE   200mg  BID for 2 weeks then 200mg  PO QD      bisacodyl 10 MG suppository   Commonly known as: DULCOLAX   Place 1 suppository (10 mg total) rectally once.      budesonide-formoterol 160-4.5 MCG/ACT inhaler   Commonly known as: SYMBICORT   Inhale 2 puffs into the lungs 2 (two) times daily.      diltiazem 180 MG 24 hr capsule   Commonly known as: CARDIZEM CD   Take 1 capsule (180 mg total) by mouth daily.      feeding supplement Liqd   Take 237 mLs by mouth daily.      food thickener Powd   Commonly known as: RESOURCE THICKENUP CLEAR   Take 120 g by mouth as needed.      furosemide 40 MG tablet   Commonly known as: LASIX   Take 1 tablet (40 mg total) by mouth daily.      HYDROcodone-acetaminophen 5-325 MG per tablet   Commonly known as: NORCO   Take 1 tablet by mouth every 6 (six) hours as needed.     levalbuterol 45 MCG/ACT inhaler   Commonly known as: XOPENEX HFA   Inhale 1-2 puffs into the lungs every 4 (four) hours as needed for wheezing or shortness of breath.      levofloxacin 750 MG tablet   Commonly known as: LEVAQUIN   Take 1 tablet (750 mg total) by mouth daily.      menthol-cetylpyridinium 3 MG lozenge   Commonly known as: CEPACOL   Take 1 lozenge (3 mg total) by mouth as needed (sore throat).      polyethylene glycol packet   Commonly known as: MIRALAX / GLYCOLAX   Take 17 g by mouth daily.      potassium chloride SA 20 MEQ tablet   Commonly known as: K-DUR,KLOR-CON   Take 1 tablet (20 mEq total) by mouth daily.      tiotropium 18 MCG inhalation capsule   Commonly known as: SPIRIVA   Place 18 mcg into inhaler and inhale daily.      TUMS PO   Take 1 tablet by mouth as needed. For heartburn/upset stomach             Disposition and Follow-up: Pt medically stable and ready for discharge to facility.  Follow-up Information    Follow up with VYAS,DHRUV B., MD. Schedule an appointment as soon as possible for a visit in 1 week. (Will need BMET and CBC 02/26/12. )    Contact information:   [address] [place] [address] [phone]          Consults: Orthopedics Pulmonary oncology   Physical Exam GEN: Awake Alert, Oriented *3, somewhat frail, NAD  HEENT: [place].AT,PERRAL  Supple Neck,No JVD, No cervical lymphadenopathy appriciated.  Symmetrical Chest wall movement, Good air movement bilaterally, right basilar rales  RRR,No Gallops,Rubs or new Murmurs, No Parasternal Heave  + BS today, Abd Soft but distended, Non tender, No organomegaly appriciated, No rebound -guarding or rigidity.  No Cyanosis, Clubbing or edema,  No new Rash or bruise    Significant Diagnostic Studies:  Dg Chest 1 View  02/13/2012  *RADIOLOGY REPORT*  Clinical Data: Left femoral neck fracture.  Preoperative respiratory evaluation.  Former smoker with history of COPD  and CHF.  CHEST - 1 VIEW  Comparison: None.  Findings: Cardiac silhouette enlarged.  Thoracic aorta atherosclerotic.  Hilar and mediastinal contours otherwise unremarkable.  Emphysematous changes throughout both lungs. Diffuse interstitial opacities which may represent chronic fibrosis, particularly at the right lung base.  No confluent airspace consolidation.  No visible pleural effusions.  IMPRESSION: Severe COPD/emphysema.  Cardiomegaly without pulmonary edema. Diffuse interstitial opacities which may be chronic and reflect interstitial fibrosis, particularly at the right lung base.  Original Report Authenticated By: Arnell Sieving, M.D.   Dg Hip 1 View Left  02/14/2012  *RADIOLOGY REPORT*  Clinical Data: Postop  LEFT HIP - 1 VIEW:  Comparison: Yesterday  Findings: Left hip hemiarthroplasty is in place.  Anatomic alignment of the osseous and prosthetic structures.  No breakage or loosening of the hardware.  IMPRESSION: Left hip hemiarthroplasty anatomically aligned.  Original Report Authenticated By: Donavan Burnet, M.D.   Dg Hip Complete Left  02/13/2012  *RADIOLOGY REPORT*  Clinical Data: Fall.  Left hip injury with pain.  LEFT HIP - COMPLETE 2+ VIEW  Comparison: None.  Findings: Comminuted subcapital or basicervical left femoral neck fracture.  Mild inferomedial joint space narrowing.  Osteopenia. No other visible fractures.  Included AP pelvis demonstrates symmetric mild joint space narrowing in the right hip.  No fractures elsewhere involving the bony pelvis.  Sacroiliac joints intact with degenerative changes. Symphysis pubis intact.  Iliofemoral atherosclerosis.  IMPRESSION: Comminuted subcapital or basicervical left femoral neck fracture.  Original Report Authenticated By: Arnell Sieving, M.D.   Dg Pelvis Portable  02/14/2012  *RADIOLOGY REPORT*  Clinical Data: Postop  PORTABLE PELVIS  Comparison: Yesterday  Findings: Left hip hemiarthroplasty is in place.  There is anatomic alignment of  the osseous and prosthetic structures.  No breakage or loosening of the hardware.  IMPRESSION: Left hip hemiarthroplasty anatomically aligned.  Original Report Authenticated By: Donavan Burnet, M.D.    Labs Reviewed  CBC - Abnormal; Notable for the following:    WBC 26.2 (*)     RBC 3.62 (*)     Hemoglobin 10.5 (*)     HCT 31.8 (*)     RDW 16.8 (*)     All other components within normal limits  DIFFERENTIAL - Abnormal; Notable for the following:    Neutrophils Relative 83 (*)     Lymphocytes Relative 11 (*)     Neutro Abs 21.7 (*)     Monocytes Absolute 1.6 (*)     All other components within  normal limits  COMPREHENSIVE METABOLIC PANEL - Abnormal; Notable for the following:    Sodium 134 (*)     Glucose, Bld 136 (*)     Creatinine, Ser 1.23 (*)     GFR calc non Af Amer 42 (*)     GFR calc Af Amer 48 (*)     All other components within normal limits  PRO B NATRIURETIC PEPTIDE - Abnormal; Notable for the following:    Pro B Natriuretic peptide (BNP) 2650.0 (*)     All other components within normal limits  URINALYSIS, ROUTINE W REFLEX MICROSCOPIC - Abnormal; Notable for the following:    Hgb urine dipstick TRACE (*)     Protein, ur 30 (*)     Leukocytes, UA SMALL (*)     All other components within normal limits  CBC - Abnormal; Notable for the following:    WBC 15.1 (*)     RBC 3.32 (*)     Hemoglobin 9.6 (*)     HCT 29.4 (*)     RDW 16.6 (*)     All other components within normal limits  DIFFERENTIAL - Abnormal; Notable for the following:    Neutrophils Relative 89 (*)     Lymphocytes Relative 6 (*)     Neutro Abs 13.4 (*)     All other components within normal limits  COMPREHENSIVE METABOLIC PANEL - Abnormal; Notable for the following:    Sodium 133 (*)     Chloride 95 (*)     Glucose, Bld 206 (*)     Creatinine, Ser 1.19 (*)     Albumin 3.2 (*)     GFR calc non Af Amer 43 (*)     GFR calc Af Amer 50 (*)     All other components within normal limits  CBC -  Abnormal; Notable for the following:    WBC 13.3 (*)     RBC 3.58 (*)     Hemoglobin 10.3 (*)     HCT 31.9 (*)     RDW 16.8 (*)     All other components within normal limits  BASIC METABOLIC PANEL - Abnormal; Notable for the following:    Sodium 134 (*)     Chloride 95 (*)     Glucose, Bld 103 (*)     Creatinine, Ser 1.14 (*)     GFR calc non Af Amer 46 (*)     GFR calc Af Amer 53 (*)     All other components within normal limits  CBC - Abnormal; Notable for the following:    RBC 2.49 (*)     Hemoglobin 7.2 (*)     HCT 22.0 (*)     RDW 17.0 (*)     All other components within normal limits  BASIC METABOLIC PANEL - Abnormal; Notable for the following:    Sodium 132 (*)     Creatinine, Ser 1.22 (*)     Calcium 8.0 (*)     GFR calc non Af Amer 42 (*)     GFR calc Af Amer 49 (*)     All other components within normal limits  CBC - Abnormal; Notable for the following:    WBC 13.0 (*)     RBC 3.39 (*)     Hemoglobin 9.8 (*)     HCT 28.9 (*)     RDW 17.1 (*)     All other components within normal limits  BASIC METABOLIC PANEL - Abnormal;  Notable for the following:    Sodium 132 (*)     Potassium 3.3 (*)     Chloride 93 (*)     Glucose, Bld 107 (*)     Creatinine, Ser 1.14 (*)     Calcium 8.0 (*)     GFR calc non Af Amer 46 (*)     GFR calc Af Amer 53 (*)     All other components within normal limits  CBC - Abnormal; Notable for the following:    WBC 10.7 (*)     RBC 3.47 (*)     Hemoglobin 10.2 (*)     HCT 29.7 (*)     RDW 17.0 (*)     All other components within normal limits  CBC - Abnormal; Notable for the following:    WBC 10.7 (*)     RBC 3.08 (*)     Hemoglobin 8.9 (*)     HCT 26.4 (*)     RDW 16.9 (*)     All other components within normal limits  BASIC METABOLIC PANEL - Abnormal; Notable for the following:    Sodium 133 (*)     Potassium 3.0 (*)     Chloride 92 (*)     CO2 33 (*)     Glucose, Bld 105 (*)     Creatinine, Ser 1.11 (*)     Calcium 8.2  (*)     GFR calc non Af Amer 47 (*)     GFR calc Af Amer 54 (*)     All other components within normal limits  PRO B NATRIURETIC PEPTIDE - Abnormal; Notable for the following:    Pro B Natriuretic peptide (BNP) 5879.0 (*)     All other components within normal limits  CBC - Abnormal; Notable for the following:    WBC 10.6 (*)     RBC 3.08 (*)     Hemoglobin 8.9 (*)     HCT 26.9 (*)     RDW 17.0 (*)     All other components within normal limits  BASIC METABOLIC PANEL - Abnormal; Notable for the following:    Sodium 134 (*)     Potassium 3.3 (*)     Chloride 92 (*)     CO2 34 (*)     Glucose, Bld 118 (*)     GFR calc non Af Amer 51 (*)     GFR calc Af Amer 59 (*)     All other components within normal limits  SEDIMENTATION RATE - Abnormal; Notable for the following:    Sed Rate 78 (*)     All other components within normal limits  CBC - Abnormal; Notable for the following:    RBC 3.09 (*)     Hemoglobin 8.9 (*)     HCT 27.5 (*)     RDW 17.2 (*)     All other components within normal limits  BASIC METABOLIC PANEL - Abnormal; Notable for the following:    Sodium 133 (*)     Chloride 94 (*)     CO2 33 (*)     Glucose, Bld 104 (*)     GFR calc non Af Amer 50 (*)     GFR calc Af Amer 58 (*)     All other components within normal limits  BASIC METABOLIC PANEL - Abnormal; Notable for the following:    Sodium 130 (*)     Chloride 91 (*)  Glucose, Bld 134 (*)     BUN 30 (*)     Creatinine, Ser 1.13 (*)     GFR calc non Af Amer 46 (*)     GFR calc Af Amer 53 (*)     All other components within normal limits  CBC - Abnormal; Notable for the following:    WBC 12.5 (*)     RBC 3.79 (*)     Hemoglobin 10.8 (*)     HCT 34.1 (*)     RDW 16.8 (*)     All other components within normal limits  TYPE AND SCREEN  URINALYSIS, ROUTINE W REFLEX MICROSCOPIC  PROTIME-INR  ABO/RH  PROTIME-INR  URINE CULTURE  APTT  SURGICAL PCR SCREEN  URINE MICROSCOPIC-ADD ON  PREPARE RBC  (CROSSMATCH)  SODIUM, URINE, RANDOM  OSMOLALITY, URINE  OSMOLALITY  MAGNESIUM  PROCALCITONIN  MAGNESIUM  ANA  ANTI-SCLERODERMA ANTIBODY  SJOGRENS SYNDROME-A EXTRACTABLE NUCLEAR ANTIBODY  SJOGRENS SYNDROME-B EXTRACTABLE NUCLEAR ANTIBODY  CYCLIC CITRUL PEPTIDE ANTIBODY, IGG  CULTURE, EXPECTORATED SPUTUM-ASSESSMENT  RHEUMATOID FACTORS, FLUID     Procedure(s): ARTHROPLASTY BIPOLAR HIP   Dg Chest 1 View  02/13/2012  *RADIOLOGY REPORT*  Clinical Data: Left femoral neck fracture.  Preoperative respiratory evaluation.  Former smoker with history of COPD and CHF.  CHEST - 1 VIEW  Comparison: None.  Findings: Cardiac silhouette enlarged.  Thoracic aorta atherosclerotic.  Hilar and mediastinal contours otherwise unremarkable.  Emphysematous changes throughout both lungs. Diffuse interstitial opacities which may represent chronic fibrosis, particularly at the right lung base.  No confluent airspace consolidation.  No visible pleural effusions.  IMPRESSION: Severe COPD/emphysema.  Cardiomegaly without pulmonary edema. Diffuse interstitial opacities which may be chronic and reflect interstitial fibrosis, particularly at the right lung base.  Original Report Authenticated By: Arnell Sieving, M.D.   Dg Hip 1 View Left  02/14/2012  *RADIOLOGY REPORT*  Clinical Data: Postop  LEFT HIP - 1 VIEW:  Comparison: Yesterday  Findings: Left hip hemiarthroplasty is in place.  Anatomic alignment of the osseous and prosthetic structures.  No breakage or loosening of the hardware.  IMPRESSION: Left hip hemiarthroplasty anatomically aligned.  Original Report Authenticated By: Donavan Burnet, M.D.   Dg Hip Complete Left  02/13/2012  *RADIOLOGY REPORT*  Clinical Data: Fall.  Left hip injury with pain.  LEFT HIP - COMPLETE 2+ VIEW  Comparison: None.  Findings: Comminuted subcapital or basicervical left femoral neck fracture.  Mild inferomedial joint space narrowing.  Osteopenia. No other visible fractures.  Included AP  pelvis demonstrates symmetric mild joint space narrowing in the right hip.  No fractures elsewhere involving the bony pelvis.  Sacroiliac joints intact with degenerative changes. Symphysis pubis intact.  Iliofemoral atherosclerosis.  IMPRESSION: Comminuted subcapital or basicervical left femoral neck fracture.  Original Report Authenticated By: Arnell Sieving, M.D.   Dg Pelvis Portable  02/14/2012  *RADIOLOGY REPORT*  Clinical Data: Postop  PORTABLE PELVIS  Comparison: Yesterday  Findings: Left hip hemiarthroplasty is in place.  There is anatomic alignment of the osseous and prosthetic structures.  No breakage or loosening of the hardware.  IMPRESSION: Left hip hemiarthroplasty anatomically aligned.  Original Report Authenticated By: Donavan Burnet, M.D.   Dg Chest Port 1 View  02/22/2012  *RADIOLOGY REPORT*  Clinical Data: Worsening cough  PORTABLE CHEST - 1 VIEW  Comparison: Portable exam 1118 hours compared to 02/17/2012  Findings: Enlargement of cardiac silhouette. Calcified tortuous aorta. Emphysematous and chronic bronchitic changes. Persistent interstitial infiltrates are seen in both  lungs, right greater than left, though these appear improved in the lower right chest versus the previous exam. No new areas of consolidation, pleural effusion or pneumothorax. Osseous demineralization.  IMPRESSION: Enlargement of cardiac silhouette. Changes of COPD with persistent bilateral interstitial changes throughout both lungs, likely representing combination of chronic interstitial lung disease/fibrosis with acute superimposed infiltrate in the lower right lung. Aeration in the lower right lung appears slightly improved since previous exam.  Original Report Authenticated By: Lollie Marrow, M.D.   Dg Chest Port 1 View  02/17/2012  *RADIOLOGY REPORT*  Clinical Data: Pneumonia  PORTABLE CHEST - 1 VIEW  Comparison:   the previous day's study  Findings: Slight improvement in the lower lobe airspace opacities,  right greater than left.  Mild interstitial infiltrates or edema persist.  Probable small pleural effusions.  Heart size upper limits normal.  Atheromatous aortic arch.  IMPRESSION:  1.  Slight improvement in asymmetric infiltrates with persistent small effusions.  Original Report Authenticated By: Osa Craver, M.D.   Dg Chest Port 1 View  02/16/2012  *RADIOLOGY REPORT*  Clinical Data: Cough.  Recent hip surgery.  PORTABLE CHEST - 1 VIEW  Comparison: 02/13/2012  Findings: Cardiac enlargement.  Pulmonary vascularity appears unremarkable. Emphysematous changes.  Prominent diffuse interstitial fibrosis throughout the lungs.  Developing superimposed airspace disease may be present in the right lower lung but increased density in this area could just be due to differences in technique.  No obvious blunting of costophrenic angles.  No pneumothorax.  Calcification of the aorta.  IMPRESSION: Emphysematous changes and diffuse parenchymal fibrosis.  Possible superimposed developing airspace infiltration or pneumonia in the right lower lung.  Original Report Authenticated By: Marlon Pel, M.D.   Dg Abd Acute W/chest  02/16/2012  *RADIOLOGY REPORT*  Clinical Data: Abdominal pain, cough, shortness of breath  ACUTE ABDOMEN SERIES (ABDOMEN 2 VIEW & CHEST 1 VIEW)  Comparison: 02/13/2012  Findings: Patchy right mid/lower lung opacities, worrisome for multifocal pneumonia.  Underlying chronic interstitial markings/emphysematous changes. No pleural effusion or pneumothorax.  Cardiomediastinal silhouette is within normal limits.  Gaseous distension of the left colon.  Moderate stool in the right colon.  No disproportionate small bowel dilatation to suggest small bowel obstruction.  No evidence of free air on the lateral decubitus view.  Vascular calcifications.  Left total hip arthroplasty.  IMPRESSION: Patchy right mid/lower lung opacities, worrisome for multifocal pneumonia.  No evidence of bowel obstruction or  free air.  Moderate stool in the right colon.  Gaseous distension of the left colon, possibly reflecting adynamic colonic ileus.  Original Report Authenticated By: Charline Bills, M.D.   Dg Abd Portable 1v  02/16/2012  *RADIOLOGY REPORT*  Clinical Data: Abdominal pain and distension.  PORTABLE ABDOMEN - 1 VIEW  Comparison: None.  Findings: Stool filled colon with mild dilatation of small and large bowel most consistent with ileus.  Early obstruction is not excluded and follow-up is recommended if the patient's symptoms persist.  Calcifications projected over the pelvic region likely represent injection granulomas in the soft tissues.  Diffuse vascular calcification.  No radiopaque stones are appreciated. Degenerative changes in the spine.  Postoperative change in the left hip.  Scarring in the lung bases.  IMPRESSION: Nonspecific bowel gas pattern with mild small and large bowel prominence, likely representing ileus.  Original Report Authenticated By: Marlon Pel, M.D.       Brief H and P: For complete details please refer to admission H and P, but in brief  78 year old female with history of COPD/emphysema on home oxygen, Atrial fibrillation (not on coumadin) chronic diastolic CHF, HTN who presented to ED on 02/13/12 statu post fall at home after she has tripped over oxygen tube. Patient described pain in her left hip, non radiating, 7-10 in intensity, unable to bear weight on this side. Patient reported not taking medications for pain. Pain started only status post fall. Patient denied chest pain, abdominal pain, nausea or vomiting. NO complaints of shortness of breath, cough, fever or chills.   Hospital Course:   Principal Problem:  *Fracture of femoral neck, left Active Problems:  COPD (chronic obstructive pulmonary disease)  Acute on chronic diastolic congestive heart failure  HTN (hypertension)  CKD (chronic kidney disease), stage III  Anemia of chronic disease  Hyponatremia   Atrial fibrillation  Acute respiratory failure with hypoxia  ILD (interstitial lung disease) 1. Mechanical fall causing Comminuted subcapital or basicervical left femoral neck fracture status post Left hip hemiarthroplasty #5 press fit stem, +0 neck, 44 uni head by Dr. supple on 02/14/2012 - Pt admitted to tele.  Patient received PT OT  Somewhat reluctantly. Need constant encouragement for mobilization. Patient has persistently refused any chemical prophylaxis as she says even 81 mg of aspirin makes her nose bleed, she was counseled about the risks of DVT PE death and disability however she continued to refuse any chemical DVT prophylaxis. We provided her with SCDs. Pain control. Will follow up with Dr. Rennis Chris 2. History of atrial fibrillation patient not on Coumadin at home. Remained  Rate controled- home dose Cardizem and amiodarone will be continued  3. Leukocytosis with chest x-ray changes of acute versus chronic interstitial changes- patient received 24 hours of vancomycin and Zosyn, on 02/15/2012 since her numbers looked better and she felt good she was switched to oral Levaquin, however it appeared as though patient could have aspirated 24 hours into the hospital stay, right lower lobe infiltrate is worse. Pt seen by speech for  aspiration precautions, feeding assistance. Recommending [employer] protocol. Levaquin discontinued 6/30 but restarted on day of discharge for 10 more days due to increase cough and mildly elevated WC. Chest xray on 7/1 yields improved arreation on right as well as chronic changes superimposed on acute infiltrate. At discharge continue oxygen and nebulizer treatments. Sats >90% on 2-3 L. (almost baseline) Pulmonary consult obtained. Recommeded CT scan of chest- patient still reluctant to have-. Pt afebrile and non-toxic appearing. Will need CBC and BMET 02/26/12.  4. Ileus: reglan ATC, had BM  with enema, add stool softeners (no nausea/vomiting), limit pain meds- good BS today    5.Hypertension stable - continue home medications no acute issues.  6. Anemia of chronic disease with acute decline. with perioperative blood loss - as post 2 units of packed RBC transfusion on 02/15/2012 followed by IV Lasix , followup H&H is stable.  7. Mild hyponatremia- ?SIADH from surgery   DVT Prophylaxis - SCDs - patient has persistently refused any chemical prophylaxis as she says even 81 mg of aspirin makes her nose bleed, she was counseled about the risks of DVT PE death and disability however she continues to refuse any chemical DVT prophylaxis.    Procedures - Left hip hemiarthroplasty #5 press fit stem, +0 neck, 44 uni head by Dr. supple on 02/14/2012, soapsuds enema.   Plan to change diet: mechanical soft/ground meats for energy conservation and continue nectar liquids- aspiration precuations      Time spent on Discharge: 45 minutes  SignedGwenyth Bender 02/22/2012, 1:04 PM

## 2012-02-28 ENCOUNTER — Ambulatory Visit (HOSPITAL_COMMUNITY): Payer: Medicare Other | Attending: Internal Medicine

## 2012-02-28 DIAGNOSIS — R109 Unspecified abdominal pain: Secondary | ICD-10-CM | POA: Insufficient documentation

## 2012-02-29 ENCOUNTER — Ambulatory Visit: Payer: Self-pay | Admitting: Physician Assistant

## 2012-03-02 ENCOUNTER — Ambulatory Visit: Payer: Self-pay | Admitting: Physician Assistant

## 2012-03-21 ENCOUNTER — Ambulatory Visit (HOSPITAL_COMMUNITY)
Admit: 2012-03-21 | Discharge: 2012-03-21 | Disposition: A | Discharge status: Skilled Nursing Facility | Payer: Medicare Other | Source: Skilled Nursing Facility | Attending: Internal Medicine | Admitting: Internal Medicine

## 2012-03-21 DIAGNOSIS — R142 Eructation: Secondary | ICD-10-CM | POA: Insufficient documentation

## 2012-03-21 DIAGNOSIS — R141 Gas pain: Secondary | ICD-10-CM | POA: Insufficient documentation

## 2012-03-28 ENCOUNTER — Ambulatory Visit: Payer: Self-pay | Admitting: Physician Assistant

## 2012-04-07 ENCOUNTER — Ambulatory Visit (INDEPENDENT_AMBULATORY_CARE_PROVIDER_SITE_OTHER): Payer: Medicare Other | Admitting: Urgent Care

## 2012-04-07 ENCOUNTER — Encounter: Payer: Self-pay | Admitting: Urgent Care

## 2012-04-07 VITALS — BP 132/54 | HR 47 | Temp 97.5°F | Ht 60.0 in | Wt 109.0 lb

## 2012-04-07 DIAGNOSIS — K5909 Other constipation: Secondary | ICD-10-CM | POA: Insufficient documentation

## 2012-04-07 DIAGNOSIS — K5900 Constipation, unspecified: Secondary | ICD-10-CM

## 2012-04-07 NOTE — Assessment & Plan Note (Signed)
Lynn Evans is a pleasant 78 y.o. female with chronic constipation abdominal distention. No relief with MiraLax, Dulcolax, fleets enemas. Multiple x-rays reviewed show severe stool burden. Hemoccults negative x3. Offered colonoscopy to patient however she does not want to pursue this at this time. I offered CT scan abdomen and pelvis with IV and oral contrast if her creatinine is normal now, patient is reluctant to do this, however her son is encouraging her. She is in agreement for now.   will help to look for occult mass, Ogilvie's syndrome, ileus.     CT with contrast of abdomen/pelvis. If benign, will begin adequate bowel prep.

## 2012-04-07 NOTE — Progress Notes (Signed)
Primary Care Physician:  Ignatius Specking., MD Primary Gastroenterologist:  Dr. Jena Gauss  Chief Complaint  Patient presents with  . Abdominal Pain  . Gas    HPI:  Lynn Evans is a 78 y.o. female here as a new patient for evaluation of chronic abdominal bloating & constipation.  She notes chronic constipation for 3 months now.  C/o BM q 3-4 days.  She has tried dulcolax, fleets, miralax without good results.  c/o back pain.  Abdomen is significantly distended.  Hx left hip fx June 2013 when she tripped & fell.  Resides at [facility] since July 1.   AAS_7/29/13-severe amount of stool. No free intraperitoneal gas.  02/28/12 abdominal films-very large stool burden throughout the colon from cecum to rectum, otherwise normal.  Hemoccult negative x3.  Denies any lower GI symptoms including constipation, rectal bleeding, melena.  She has lost several pounds since she fractured her hip.  Denies fever or chills.   Denies any upper GI symptoms including heartburn, indigestion, nausea, vomiting, dysphagia, odynophagia or anorexia.  Denies any lifelong history of constipation.  Never had colonoscopy.  Reported recent TSH normal.  Past Medical History  Diagnosis Date  . CHF (congestive heart failure)   . COPD (chronic obstructive pulmonary disease)   . Dysrhythmia   . Arthritis   . H/O fracture of left hip   . HTN (hypertension)   . CAD (coronary artery disease)     Past Surgical History  Procedure Date  . Hemorrhoid surgery   . Hip arthroplasty 02/14/2012    Procedure: ARTHROPLASTY BIPOLAR HIP;  Surgeon: Senaida Lange, MD;  Location: [facility];  Service: Orthopedics;  Laterality: Left;    No current outpatient prescriptions on file.    Allergies as of 04/07/2012  . (No Known Allergies)    Family History  Problem Relation Age of Onset  . Colon cancer Mother   . Cirrhosis Father     History   Social History  . Marital Status: Widowed    Spouse Name: N/A    Number of Children: N/A  . Years of  Education: N/A   Occupational History  . Not on file.   Social History Main Topics  . Smoking status: Former Smoker -- 1.0 packs/day for 57 years    Types: Cigarettes    Quit date: 11/16/2011  . Smokeless tobacco: Former Neurosurgeon  . Alcohol Use: No  . Drug Use: No  . Sexually Active: Not on file   Review of Systems: Gen: Denies any fever, chills, sweats, anorexia, fatigue, weakness, malaise,  and sleep disorder CV: Denies chest pain, angina, palpitations, syncope, orthopnea, PND, peripheral edema, and claudication. Resp: Denies dyspnea at rest, dyspnea with exercise, cough, sputum, wheezing, coughing up blood, and pleurisy. GI: Denies vomiting blood, jaundice, and fecal incontinence.   GU : Denies urinary burning, blood in urine, urinary frequency, urinary hesitancy, nocturnal urination, and urinary incontinence. MS: see history of present illness Derm: Denies rash, itching, dry skin, hives, moles, warts, or unhealing ulcers.  Psych: Denies depression, anxiety, memory loss, suicidal ideation, hallucinations, paranoia, and confusion. Heme: Denies bruising, bleeding, and enlarged lymph nodes. Neuro:  Denies any headaches, dizziness, paresthesias. Endo:  Denies any problems with DM, thyroid, adrenal function.  Physical Exam: BP 132/54  Pulse 47  Temp 97.5 F (36.4 C) (Temporal)  Ht 5' (1.524 m)  Wt 109 lb (49.442 kg)  BMI 21.29 kg/m2 No LMP recorded. Patient is postmenopausal. General:   Alert,  Well-developed, well-nourished, pleasant and cooperative in  NAD.  Accompanied by her son. She is in a wheelchair, however we were able to assist her to taper for exam. Head:  Normocephalic and atraumatic. Eyes:  Sclera clear,no icterus.   Conjunctiva pale. Ears:  Normal auditory acuity. Nose:  No deformity, discharge, or lesions. Mouth:  No deformity or lesions,oropharynx pink & moist. Neck:  Supple; no masses or thyromegaly. Lungs:  Clear throughout to auscultation.   No wheezes,  crackles, or rhonchi. No acute distress. Heart:  Regular rate and rhythm; no murmurs, clicks, rubs,  or gallops. Abdomen:  Grossly distended, moderately firm.  Normal bowel sounds.  No bruits.  Non-tender.  Unable to appreciate hepatosplenomegaly or hernias due to distention.  No guarding or rebound tenderness.   Rectal:  No significant external and internal lesions palpated. Firm anterior compression most likely from uterus appreciated.  No distal impaction appreciated. Small amount of light brown stool was obtained the vault Hemoccult negative. Msk:  Symmetrical without gross deformities. Pulses:  Normal pulses noted. Extremities:  Trace pretibial edema bilaterally. Neurologic:  Alert and  oriented x4;  grossly normal neurologically. Skin:  Intact without significant lesions or rashes. Lymph Nodes:  No significant cervical adenopathy. Psych:  Alert and cooperative. Normal mood and affect.

## 2012-04-07 NOTE — Progress Notes (Signed)
Faxed to PCP

## 2012-04-07 NOTE — Patient Instructions (Signed)
CT scan as soon as possible Be sure to get lab today To ER if severe pain Continue daily MiraLax and heat as needed Dulcolax and fleets for now. We will give further recommendations pending her CT scan.

## 2012-04-08 ENCOUNTER — Ambulatory Visit (HOSPITAL_COMMUNITY)
Admit: 2012-04-08 | Discharge: 2012-04-08 | Disposition: A | Discharge status: Home / Self Care | Payer: Medicare Other | Source: Ambulatory Visit | Attending: Urgent Care | Admitting: Urgent Care

## 2012-04-08 DIAGNOSIS — K5909 Other constipation: Secondary | ICD-10-CM | POA: Insufficient documentation

## 2012-04-08 DIAGNOSIS — R109 Unspecified abdominal pain: Secondary | ICD-10-CM | POA: Insufficient documentation

## 2012-04-08 MED ORDER — IOHEXOL 300 MG/ML  SOLN
100.0000 mL | Freq: Once | INTRAMUSCULAR | Status: AC | PRN
  Administered 2012-04-08: 100 mL via INTRAVENOUS

## 2012-04-11 NOTE — Progress Notes (Signed)
Quick Note:  Please let pt's caregivers know CT normal except lots of constipation. Give 2 DULCOLAX 5mg  tablets Mix 5 teaspoons of Miralax in any 4-6 ounces of CLEAR LIQUIDS (Gatorade) & drink every hour for 8 hours until passing clear, watery stools May use 2 tap water enemas if no results after 2 hours Thanks ZO:XWRU,EAVWU B., MD  ______

## 2012-04-12 NOTE — Progress Notes (Signed)
Quick Note:  Called [facility], informed them that we have ct results and new orders. All information will be faxed to [phone] to the attention of pts nurse, Bethann Berkshire. Dawn, please cc pcp ______

## 2012-04-12 NOTE — Progress Notes (Signed)
Quick Note:  Tried to call pts daughter again, LMOM ______

## 2012-04-13 NOTE — Progress Notes (Signed)
Faxed to PCP

## 2012-04-21 ENCOUNTER — Ambulatory Visit (HOSPITAL_COMMUNITY): Payer: Medicare Other | Attending: Internal Medicine

## 2012-04-21 DIAGNOSIS — M7989 Other specified soft tissue disorders: Secondary | ICD-10-CM | POA: Insufficient documentation

## 2012-04-27 ENCOUNTER — Encounter: Payer: Self-pay | Admitting: Physician Assistant

## 2012-04-27 ENCOUNTER — Ambulatory Visit (INDEPENDENT_AMBULATORY_CARE_PROVIDER_SITE_OTHER): Payer: Medicare Other | Admitting: Physician Assistant

## 2012-04-27 VITALS — BP 110/58 | HR 54 | Ht 60.0 in | Wt 99.0 lb

## 2012-04-27 DIAGNOSIS — R0989 Other specified symptoms and signs involving the circulatory and respiratory systems: Secondary | ICD-10-CM

## 2012-04-27 DIAGNOSIS — I10 Essential (primary) hypertension: Secondary | ICD-10-CM

## 2012-04-27 DIAGNOSIS — I5033 Acute on chronic diastolic (congestive) heart failure: Secondary | ICD-10-CM

## 2012-04-27 DIAGNOSIS — I509 Heart failure, unspecified: Secondary | ICD-10-CM

## 2012-04-27 DIAGNOSIS — R609 Edema, unspecified: Secondary | ICD-10-CM

## 2012-04-27 DIAGNOSIS — I4891 Unspecified atrial fibrillation: Secondary | ICD-10-CM

## 2012-04-27 DIAGNOSIS — R0602 Shortness of breath: Secondary | ICD-10-CM

## 2012-04-27 DIAGNOSIS — R600 Localized edema: Secondary | ICD-10-CM | POA: Insufficient documentation

## 2012-04-27 NOTE — Progress Notes (Addendum)
Primary Cardiologist: Simona Huh, MD   HPI: Presents for evaluation of refractory bilateral peripheral edema. She was last seen here in clinic in May, in post hospital followup from [facility]. She has complex medical history, including respiratory failure, acute/chronic DHF, and PAF RVR requiring amiodarone initiation, and converted to NSR prior to discharge. She had previously declined Coumadin anticoagulation and when seen by me, I suggested increasing ASA to full dose. However, she informs me today that she developed gross hematuria shortly thereafter, and resumed taking ASA 81 mg daily.  Since then, she suffered a fracture of the left hip, requiring surgical repair at [facility]. She has been convalescing at [facility], and has seen Dr. Rennis Chris in followup for her left hip. She apparently is doing well from a surgical standpoint. However, she has been plagued by refractory bilateral peripheral edema, which her daughter states preceded her hip surgery.  From a cardiac standpoint, she denies symptoms of PND orthopnea. She also denies chest pain or tachycardia palpitations.  No Known Allergies  No current outpatient prescriptions on file.    Past Medical History  Diagnosis Date  . CHF (congestive heart failure)   . COPD (chronic obstructive pulmonary disease)   . Dysrhythmia   . Arthritis   . H/O fracture of left hip   . HTN (hypertension)   . CAD (coronary artery disease)   . Hypothyroid     Past Surgical History  Procedure Date  . Hemorrhoid surgery   . Hip arthroplasty 02/14/2012    Procedure: ARTHROPLASTY BIPOLAR HIP;  Surgeon: Senaida Lange, MD;  Location: [facility];  Service: Orthopedics;  Laterality: Left;    History   Social History  . Marital Status: Widowed    Spouse Name: N/A    Number of Children: N/A  . Years of Education: N/A   Occupational History  . Not on file.   Social History Main Topics  . Smoking status: Former Smoker -- 1.0 packs/day for 57 years   Types: Cigarettes    Quit date: 11/16/2011  . Smokeless tobacco: Former Neurosurgeon  . Alcohol Use: No  . Drug Use: No  . Sexually Active: Not on file   Other Topics Concern  . Not on file   Social History Narrative  . No narrative on file    Family History  Problem Relation Age of Onset  . Colon cancer Mother   . Cirrhosis Father     ROS: no nausea, vomiting; no fever, chills; no melena, hematochezia; no claudication  PHYSICAL EXAM: BP 110/58  Pulse 54  Ht 5' (1.524 m)  Wt 99 lb (44.906 kg)  BMI 19.33 kg/m2 GENERAL: 77 year old female, frail, sitting in wheelchair; NAD  HEENT: NCAT, PERRLA, EOMI; sclera clear; no xanthelasma  NECK: bilateral carotid bruits; no JVD; no TM  LUNGS: CTA bilaterally  CARDIAC: RRR (S1, S2); no significant murmurs; no rubs or gallops  ABDOMEN: soft, non-tender; intact BS  EXTREMETIES: 2-3+ bilateral pedal edema  SKIN: warm/dry; no obvious rash/lesions  MUSCULOSKELETAL: no joint deformity  NEURO: no focal deficit; NL affect    EKG: reviewed and available in Electronic Records   ASSESSMENT & PLAN:    Gene Starsha Morning, PAC

## 2012-04-27 NOTE — Assessment & Plan Note (Signed)
We'll reassess both LV and RV function with a 2-D echocardiogram, in light of refractory peripheral edema. Of note, RV function was previously assessed as normal. However, she also has documented severe pulmonary hypertension. We'll then schedule early return followup with myself/Dr. Teresita Madura in one month, for review of study results. The meanwhile, she is to continue on current diuretic dose. We will also order extensive blood work with a complete metabolic profile, BNP level, and a TSH.

## 2012-04-27 NOTE — Patient Instructions (Addendum)
   Echo  Labs - CMET, BNP, TSH - to be drawn at [facility] will contact with results Follow up in  1 month

## 2012-04-27 NOTE — Assessment & Plan Note (Signed)
Well-controlled on current medication regimen 

## 2012-04-27 NOTE — Assessment & Plan Note (Signed)
Mild nonobstructive bilateral ICA disease.

## 2012-04-27 NOTE — Assessment & Plan Note (Signed)
Maintaining NSR on amiodarone. Of note, patient is to remain on low-dose ASA, indefinitely. When last seen, I instructed her to increase ASA to full dose, given that she had previously declined Coumadin anticoagulation. However, this resulted in gross hematuria, and she had to revert back to 81 mg daily.

## 2012-04-27 NOTE — Assessment & Plan Note (Signed)
Euvolemic by history and physical exam. Weight is unchanged since last OV. Continue current diuretic regimen. Check followup labs, as outlined above.

## 2012-05-05 ENCOUNTER — Other Ambulatory Visit: Payer: Self-pay

## 2012-05-11 ENCOUNTER — Other Ambulatory Visit (INDEPENDENT_AMBULATORY_CARE_PROVIDER_SITE_OTHER): Payer: Medicare Other

## 2012-05-11 ENCOUNTER — Other Ambulatory Visit: Payer: Self-pay

## 2012-05-11 ENCOUNTER — Other Ambulatory Visit (HOSPITAL_COMMUNITY): Payer: Self-pay

## 2012-05-11 DIAGNOSIS — R609 Edema, unspecified: Secondary | ICD-10-CM

## 2012-05-11 DIAGNOSIS — I509 Heart failure, unspecified: Secondary | ICD-10-CM

## 2012-05-11 DIAGNOSIS — I4891 Unspecified atrial fibrillation: Secondary | ICD-10-CM

## 2012-05-11 DIAGNOSIS — R0989 Other specified symptoms and signs involving the circulatory and respiratory systems: Secondary | ICD-10-CM

## 2012-05-13 ENCOUNTER — Ambulatory Visit (HOSPITAL_COMMUNITY)
Admit: 2012-05-13 | Discharge: 2012-05-13 | Disposition: A | Discharge status: Home / Self Care | Payer: Medicare Other | Source: Ambulatory Visit | Attending: Urology | Admitting: Urology

## 2012-05-13 DIAGNOSIS — R3916 Straining to void: Secondary | ICD-10-CM | POA: Insufficient documentation

## 2012-05-13 DIAGNOSIS — R9389 Abnormal findings on diagnostic imaging of other specified body structures: Secondary | ICD-10-CM | POA: Insufficient documentation

## 2012-05-13 DIAGNOSIS — N35919 Unspecified urethral stricture, male, unspecified site: Secondary | ICD-10-CM | POA: Insufficient documentation

## 2012-05-16 ENCOUNTER — Telehealth: Payer: Self-pay | Admitting: *Deleted

## 2012-05-16 NOTE — Telephone Encounter (Signed)
Message copied by Lesle Chris on Mon May 16, 2012 10:16 AM ------      Message from: Lynn Evans      Created: Thu May 12, 2012  3:43 PM       NL LVF, moderate valvular disease. Review at scheduled f/u

## 2012-05-16 NOTE — Telephone Encounter (Signed)
Notes Recorded by Lesle Chris, LPN on 7/84/6962 at 10:16 AM Patient notified. ------  Notes Recorded by Lesle Chris, LPN on 9/52/8413 at 10:04 AM Received return call from daughter (Jan) - mom is home now. Attempted to reach patient at home - no answer. ------  Notes Recorded by Lesle Chris, LPN on 2/44/0102 at 9:56 AM Left message to return call with daughter Ilene Qua) - pt stays at [facility].

## 2012-05-30 ENCOUNTER — Encounter: Payer: Self-pay | Admitting: Cardiology

## 2012-05-30 ENCOUNTER — Ambulatory Visit (INDEPENDENT_AMBULATORY_CARE_PROVIDER_SITE_OTHER): Payer: Medicare Other | Admitting: Cardiology

## 2012-05-30 VITALS — BP 146/75 | HR 54 | Ht 60.0 in | Wt 99.8 lb

## 2012-05-30 DIAGNOSIS — I4891 Unspecified atrial fibrillation: Secondary | ICD-10-CM

## 2012-05-30 DIAGNOSIS — R609 Edema, unspecified: Secondary | ICD-10-CM

## 2012-05-30 DIAGNOSIS — R600 Localized edema: Secondary | ICD-10-CM

## 2012-05-30 NOTE — Progress Notes (Signed)
Clinical Summary Lynn Evans is a 78 y.o.female presenting for followup. She has been seen by Mr. Serpe in the office. Recent history reviewed. She states that she completed inpatient rehabilitation, is now living at home. She is using a walker, denies any falls. States that her leg edema has improved significantly. Her weight is stable compared to last visit. She did not follow up for the lab work recommended at the last visit, states that she already had "too much."  Followup echocardiogram done in September revealed mild LVH LVEF 60%, aortic valve sclerosis with mild to moderate aortic regurgitation, mild to moderate mitral regurgitation, moderate left atrial enlargement, mild right atrial enlargement, moderate tricuspid regurgitation. Pulmonary pressure was not listed unfortunately, however the TR velocity was 334 cm/s - consistent with elevated pressure.  ECG today shows sinus bradycardia at 54 beats per minute with nonspecific ST-T changes.   No Known Allergies  Current Outpatient Prescriptions  Medication Sig Dispense Refill  . amiodarone (PACERONE) 200 MG tablet Take 200 mg by mouth daily.      Marland Kitchen aspirin 81 MG tablet Take 81 mg by mouth daily.      . bisacodyl (DULCOLAX) 10 MG suppository Place 10 mg rectally as needed.      . budesonide-formoterol (SYMBICORT) 160-4.5 MCG/ACT inhaler Inhale 2 puffs into the lungs 2 (two) times daily.      . carvedilol (COREG) 6.25 MG tablet Take 6.25 mg by mouth 2 (two) times daily with a meal.      . enalapril (VASOTEC) 5 MG tablet Take 5 mg by mouth daily.      . furosemide (LASIX) 40 MG tablet Take 20 mg by mouth daily.       Marland Kitchen levothyroxine (SYNTHROID, LEVOTHROID) 50 MCG tablet Take 50 mcg by mouth daily.      . polyethylene glycol (MIRALAX / GLYCOLAX) packet Take 17 g by mouth daily.      Marland Kitchen tiotropium (SPIRIVA) 18 MCG inhalation capsule Place 18 mcg into inhaler and inhale daily.        Past Medical History  Diagnosis Date  . Chronic  diastolic heart failure   . COPD (chronic obstructive pulmonary disease)   . Atrial fibrillation   . Arthritis   . H/O fracture of left hip   . Essential hypertension, benign   . Carotid artery disease     Mild nonobstructive  . Hypothyroidism   . Chronic edema   . Pulmonary hypertension     Moderate to severe    Social History Lynn Evans reports that she quit smoking about 11 months ago. Her smoking use included Cigarettes. She has a 57 pack-year smoking history. She has quit using smokeless tobacco. Lynn Evans reports that she does not drink alcohol.  Review of Systems No significant bleeding problems on baby aspirin. She was having nosebleeds on full dose aspirin. No melena or hematochezia. Reports fair appetite. No dizziness or syncope. Otherwise negative.  Physical Examination Filed Vitals:   05/30/12 1427  BP: 146/75  Pulse: 54   Filed Weights   05/30/12 1427  Weight: 99 lb 12.8 oz (45.269 kg)   Thin elderly woman in no acute distress. HEENT: Conjunctiva and lids normal, oropharynx clear. Neck: Supple, no elevated JVP, no thyromegaly. Lungs: Clear to auscultation, nonlabored breathing at rest. Cardiac: Regular rate and rhythm, no S3, 2/6 systolic murmur, no pericardial rub. Abdomen: Soft, nontender, bowel sounds present, no guarding or rebound. Extremities: Trace to 1+ edema, distal pulses 1-2+. Distal stasis. Skin:  Warm and dry. Musculoskeletal: Kyphosis. Neuropsychiatric: Alert and oriented x3, affect grossly appropriate.     Problem List and Plan   Atrial fibrillation Maintaining sinus rhythm. She declines anticoagulation, reports significant nosebleeds even on full dose aspirin. She has been able to take baby aspirin. Will otherwise continue amiodarone and Coreg.  Peripheral edema Largely improved on current diuretic regimen. No changes made. Suspect that a lot of this may be related to her pulmonary hypertension with RV strain. Recent echocardiogram  shows normal LV function with known diastolic dysfunction, also valvular heart disease which is not clearly symptom provoking. Will request BMET for next visit in 2 months.    Jonelle Sidle, M.D., F.A.C.C.

## 2012-05-30 NOTE — Assessment & Plan Note (Signed)
Largely improved on current diuretic regimen. No changes made. Suspect that a lot of this may be related to her pulmonary hypertension with RV strain. Recent echocardiogram shows normal LV function with known diastolic dysfunction, also valvular heart disease which is not clearly symptom provoking. Will request BMET for next visit in 2 months.

## 2012-05-30 NOTE — Patient Instructions (Addendum)
Your physician recommends that you schedule a follow-up appointment in: 2 months. Your physician recommends that you continue on your current medications as directed. Please refer to the Current Medication list given to you today.  Your physician recommends that you return for lab work around December 6th 2013 at [facility] for [employer].

## 2012-05-30 NOTE — Assessment & Plan Note (Signed)
Maintaining sinus rhythm. She declines anticoagulation, reports significant nosebleeds even on full dose aspirin. She has been able to take baby aspirin. Will otherwise continue amiodarone and Coreg.

## 2012-06-06 ENCOUNTER — Telehealth: Payer: Self-pay | Admitting: *Deleted

## 2012-06-06 MED ORDER — AMIODARONE HCL 200 MG PO TABS: 200.0000 mg | ORAL_TABLET | Freq: Every day | ORAL | Status: DC

## 2012-06-06 MED ORDER — ENALAPRIL MALEATE 5 MG PO TABS: 5.0000 mg | ORAL_TABLET | Freq: Every day | ORAL | Status: DC

## 2012-06-06 MED ORDER — FUROSEMIDE 20 MG PO TABS: 20.0000 mg | ORAL_TABLET | Freq: Every day | ORAL | Status: DC

## 2012-06-06 MED ORDER — CARVEDILOL 6.25 MG PO TABS: 6.2500 mg | ORAL_TABLET | Freq: Every day | ORAL | Status: DC

## 2012-06-06 NOTE — Telephone Encounter (Signed)
Spoke with son and he wanted to know if patient needed to stay on medications because patient did not have any refills on them since she was d/c from [facility]. Nurse reviewed medications with son and informed him that refills would be sent to pharmacy. Son advised to have synthroid,symbicort and spiriva sent refill requests sent to [facility] office.

## 2012-08-03 ENCOUNTER — Encounter: Payer: Self-pay | Admitting: Cardiology

## 2012-08-03 ENCOUNTER — Ambulatory Visit (INDEPENDENT_AMBULATORY_CARE_PROVIDER_SITE_OTHER): Payer: Medicare Other | Admitting: Cardiology

## 2012-08-03 VITALS — BP 124/71 | HR 73 | Ht 60.0 in | Wt 98.0 lb

## 2012-08-03 DIAGNOSIS — Z5181 Encounter for therapeutic drug level monitoring: Secondary | ICD-10-CM

## 2012-08-03 DIAGNOSIS — N183 Chronic kidney disease, stage 3 unspecified: Secondary | ICD-10-CM

## 2012-08-03 DIAGNOSIS — Z79899 Other long term (current) drug therapy: Secondary | ICD-10-CM

## 2012-08-03 DIAGNOSIS — I10 Essential (primary) hypertension: Secondary | ICD-10-CM

## 2012-08-03 DIAGNOSIS — I4891 Unspecified atrial fibrillation: Secondary | ICD-10-CM

## 2012-08-03 NOTE — Progress Notes (Signed)
Clinical Summary Lynn Evans is a 78 y.o.female presenting for followup. She was last seen in October. She is back at home after a stay at the [facility], still uses a walker to ambulate after her hip fracture back in the summer months. She reports no palpitations, has ankle edema but no other significant swelling. Breathing status has been stable.  She reports compliance with her medications. She does not want to consider anticoagulation, already discussed. ECG today shows sinus rhythm with nonspecific ST-T changes.  Recent BMET showed sodium 132, potassium 5.1, BUN 33, creatinine 2.0. She has not had recent TSH or LFTs.   No Known Allergies  Current Outpatient Prescriptions  Medication Sig Dispense Refill  . amiodarone (PACERONE) 200 MG tablet Take 1 tablet (200 mg total) by mouth daily.  30 tablet  6  . aspirin 81 MG tablet Take 81 mg by mouth daily.      . budesonide-formoterol (SYMBICORT) 160-4.5 MCG/ACT inhaler Inhale 2 puffs into the lungs 2 (two) times daily.      . carvedilol (COREG) 6.25 MG tablet Take 1 tablet (6.25 mg total) by mouth daily.  30 tablet  6  . enalapril (VASOTEC) 5 MG tablet Take 1 tablet (5 mg total) by mouth daily.  30 tablet  6  . furosemide (LASIX) 20 MG tablet Take 1 tablet (20 mg total) by mouth daily.  30 tablet  6  . levothyroxine (SYNTHROID, LEVOTHROID) 50 MCG tablet Take 50 mcg by mouth daily.      . polyethylene glycol (MIRALAX / GLYCOLAX) packet Take 17 g by mouth daily.      Marland Kitchen tiotropium (SPIRIVA) 18 MCG inhalation capsule Place 18 mcg into inhaler and inhale daily.        Past Medical History  Diagnosis Date  . Chronic diastolic heart failure   . COPD (chronic obstructive pulmonary disease)   . Atrial fibrillation   . Arthritis   . H/O fracture of left hip   . Essential hypertension, benign   . Carotid artery disease     Mild nonobstructive  . Hypothyroidism   . Chronic edema   . Pulmonary hypertension     Moderate to severe  . CKD  (chronic kidney disease) stage 3, GFR 30-59 ml/min     Social History Ms. Horsman reports that she quit smoking about 13 months ago. Her smoking use included Cigarettes. She has a 57 pack-year smoking history. She has quit using smokeless tobacco. Ms. Moulin reports that she does not drink alcohol.  Review of Systems No falls. Does have difficulty with her balance. No reported bleeding episodes. Appetite stable. Otherwise negative.  Physical Examination Filed Vitals:   08/03/12 1419  BP: 124/71  Pulse: 73   Filed Weights   08/03/12 1419  Weight: 98 lb (44.453 kg)    Thin elderly woman in no acute distress.  HEENT: Conjunctiva and lids normal, oropharynx clear.  Neck: Supple, no elevated JVP, no thyromegaly.  Lungs: Clear to auscultation, nonlabored breathing at rest.  Cardiac: Regular rate and rhythm, no S3, 2/6 systolic murmur, no pericardial rub.  Abdomen: Soft, nontender, bowel sounds present, no guarding or rebound.  Extremities: Ankle edema, distal pulses 1-2+. Distal stasis.    Problem List and Plan   Atrial fibrillation Maintaining sinus rhythm on current regimen. She declines anticoagulant use, is willing to take aspirin. No changes made today. Followup TSH and LFTs for her next visit.  HTN (hypertension) Blood pressure is relatively well controlled.  CKD (chronic  kidney disease) stage 3, GFR 30-59 ml/min Most recent creatinine 2.0. Would not push Lasix up any further despite her ankle edema.    Jonelle Sidle, M.D., F.A.C.C.

## 2012-08-03 NOTE — Patient Instructions (Addendum)
Your physician recommends that you schedule a follow-up appointment in: 4 months. You will receive a reminder letter in the mail in about 1-2 months reminding you to call and schedule your appointment. If you don't receive this letter, please contact our office. Your physician recommends that you continue on your current medications as directed. Please refer to the Current Medication list given to you today.   Your physician recommends that you return for lab work in 4 months just before your next visit in April 2014 for TSH, and liver function tests.

## 2012-08-03 NOTE — Assessment & Plan Note (Signed)
Most recent creatinine 2.0. Would not push Lasix up any further despite her ankle edema.

## 2012-08-03 NOTE — Assessment & Plan Note (Signed)
Maintaining sinus rhythm on current regimen. She declines anticoagulant use, is willing to take aspirin. No changes made today. Followup TSH and LFTs for her next visit.

## 2012-08-03 NOTE — Assessment & Plan Note (Signed)
Blood pressure is relatively well controlled.

## 2012-12-06 ENCOUNTER — Ambulatory Visit: Payer: Medicare Other | Admitting: Cardiology

## 2012-12-08 ENCOUNTER — Encounter: Payer: Self-pay | Admitting: Cardiology

## 2012-12-08 ENCOUNTER — Ambulatory Visit (INDEPENDENT_AMBULATORY_CARE_PROVIDER_SITE_OTHER): Payer: Medicare Other | Admitting: Cardiology

## 2012-12-08 VITALS — BP 125/77 | HR 77 | Ht 61.0 in | Wt 88.0 lb

## 2012-12-08 DIAGNOSIS — I10 Essential (primary) hypertension: Secondary | ICD-10-CM

## 2012-12-08 DIAGNOSIS — Z9229 Personal history of other drug therapy: Secondary | ICD-10-CM

## 2012-12-08 DIAGNOSIS — Z0000 Encounter for general adult medical examination without abnormal findings: Secondary | ICD-10-CM | POA: Insufficient documentation

## 2012-12-08 DIAGNOSIS — I4891 Unspecified atrial fibrillation: Secondary | ICD-10-CM

## 2012-12-08 MED ORDER — ENALAPRIL MALEATE 5 MG PO TABS: 5.0000 mg | ORAL_TABLET | Freq: Every day | ORAL | Status: DC

## 2012-12-08 MED ORDER — FUROSEMIDE 20 MG PO TABS: 20.0000 mg | ORAL_TABLET | Freq: Every day | ORAL | Status: DC

## 2012-12-08 MED ORDER — AMIODARONE HCL 200 MG PO TABS: 200.0000 mg | ORAL_TABLET | Freq: Every day | ORAL | Status: DC

## 2012-12-08 NOTE — Progress Notes (Signed)
Clinical Summary Lynn Evans is a 79 y.o.female last seen in October 2013. She is here with her daughter. She denies any progressive palpitations, no chest pain. She has lost weight, also having difficulty ambulating due to hip pain. She is in a wheelchair today.  She has declined anticoagulation, managed with low-dose aspirin and amiodarone for atrial fibrillation. No recent followup lab work. Has not seen Dr. Sherril Croon.  Lab work from December 2013 revealed potassium 5.1, BUN 33, creatinine 2.0. Previous BUN and creatinine, 30 and 1.1 respectively in June 2013. Echocardiogram done in September 2013 revealed mild LVH LVEF 60%, aortic valve sclerosis with mild to moderate aortic regurgitation, mild to moderate mitral regurgitation, moderate left atrial enlargement, mild right atrial enlargement, moderate tricuspid regurgitation. Pulmonary pressure was not listed unfortunately, however the TR velocity was 334 cm/s - consistent with elevated pressure.  ECG today shows sinus rhythm with inferolateral ST-T wave abnormalities which are old.  No Known Allergies  Current Outpatient Prescriptions  Medication Sig Dispense Refill  . amiodarone (PACERONE) 200 MG tablet Take 1 tablet (200 mg total) by mouth daily.  30 tablet  6  . aspirin 81 MG tablet Take 81 mg by mouth daily.      . budesonide-formoterol (SYMBICORT) 160-4.5 MCG/ACT inhaler Inhale 2 puffs into the lungs 2 (two) times daily.      . enalapril (VASOTEC) 5 MG tablet Take 1 tablet (5 mg total) by mouth daily.  30 tablet  6  . furosemide (LASIX) 20 MG tablet Take 1 tablet (20 mg total) by mouth daily.  30 tablet  6  . polyethylene glycol (MIRALAX / GLYCOLAX) packet Take 17 g by mouth daily.       No current facility-administered medications for this visit.    Past Medical History  Diagnosis Date  . Chronic diastolic heart failure   . COPD (chronic obstructive pulmonary disease)   . Atrial fibrillation   . Arthritis   . H/O fracture of  left hip   . Essential hypertension, benign   . Carotid artery disease     Mild nonobstructive  . Hypothyroidism   . Chronic edema   . Pulmonary hypertension     Moderate to severe  . CKD (chronic kidney disease) stage 3, GFR 30-59 ml/min     Social History Ms. Jarmon reports that she quit smoking about 17 months ago. Her smoking use included Cigarettes. She has a 57 pack-year smoking history. She has quit using smokeless tobacco. Ms. Kluttz reports that she does not drink alcohol.  Review of Systems No reported bleeding problems. No cough or hemoptysis. No fevers or chills. Otherwise negative.  Physical Examination Filed Vitals:   12/08/12 1316  BP: 125/77  Pulse: 77   Filed Weights   12/08/12 1316  Weight: 88 lb (39.917 kg)    Thin, chronically ill-appearing elderly woman in no acute distress.  HEENT: Conjunctiva and lids normal, oropharynx clear.  Neck: Supple, no elevated JVP, no thyromegaly.  Lungs: Clear to auscultation, nonlabored breathing at rest.  Cardiac: Regular rate and rhythm, no S3, 2/6 systolic murmur, no pericardial rub.  Abdomen: Soft, nontender, bowel sounds present, no guarding or rebound.  Extremities: Trace to 1+ edema, distal pulses 1-2+. Distal stasis.  Skin: Warm and dry.  Musculoskeletal: Kyphosis.  Neuropsychiatric: Alert and oriented x3, affect grossly appropriate.   Problem List and Plan   Atrial fibrillation Maintaining sinus rhythm. She continues on amiodarone and aspirin. She has declined anticoagulation. Followup TSH and LFTs.  HTN (hypertension) Blood pressure well controlled today. No changes made.   Health maintenance examination She is having problems with hip pain, also weight loss. I have encouraged her once again to follow up with her primary care physician Dr. Sherril Croon for further evaluation.  CKD (chronic kidney disease) stage 3, GFR 30-59 ml/min Last creatinine 2.0. Encouraged followup with Dr. Sherril Croon.    Jonelle Sidle, M.D., F.A.C.C.

## 2012-12-08 NOTE — Assessment & Plan Note (Signed)
Blood pressure well controlled today. No changes made. 

## 2012-12-08 NOTE — Assessment & Plan Note (Signed)
She is having problems with hip pain, also weight loss. I have encouraged her once again to follow up with her primary care physician Dr. Sherril Croon for further evaluation.

## 2012-12-08 NOTE — Patient Instructions (Addendum)
Your physician recommends that you schedule a follow-up appointment in: 6 months. You will receive a reminder letter in the mail in about 4 months reminding you to call and schedule your appointment. If you don't receive this letter, please contact our office. Your physician recommends that you continue on your current medications as directed. Please refer to the Current Medication list given to you today. Your physician recommends that you return for lab work in: today at [facility] for LFT's & TSH.

## 2012-12-08 NOTE — Assessment & Plan Note (Signed)
Maintaining sinus rhythm. She continues on amiodarone and aspirin. She has declined anticoagulation. Followup TSH and LFTs.

## 2012-12-08 NOTE — Assessment & Plan Note (Signed)
Last creatinine 2.0. Encouraged followup with Dr. Sherril Croon.

## 2012-12-13 ENCOUNTER — Telehealth: Payer: Self-pay | Admitting: *Deleted

## 2012-12-13 NOTE — Telephone Encounter (Signed)
Patient informed and copy sent to [facility] office.

## 2012-12-13 NOTE — Telephone Encounter (Signed)
Message copied by Eustace Moore on Tue Dec 13, 2012 11:54 AM ------      Message from: Jonelle Sidle      Created: Fri Dec 09, 2012  9:49 AM       Reviewed. LFTs are normal. TSH is markedly abnormal, greater than 100, consistent with hypothyroidism which is in her history. As mentioned in my office note, she has not seen her primary care physician for quite some time. Please let her know that she needs to see Dr. Sherril Croon as soon as possible to have her thyroid status addressed. Doubt that it is specifically related to amiodarone, she probably just needs to be on thyroid replacement. Please forward results to Dr. Sherril Croon. ------

## 2012-12-30 ENCOUNTER — Other Ambulatory Visit: Payer: Self-pay | Admitting: Cardiology

## 2013-02-02 ENCOUNTER — Other Ambulatory Visit: Payer: Self-pay | Admitting: Cardiology

## 2013-02-02 MED ORDER — ENALAPRIL MALEATE 5 MG PO TABS: 5.0000 mg | ORAL_TABLET | Freq: Every day | ORAL | Status: DC

## 2013-02-02 NOTE — Telephone Encounter (Signed)
Medication sent via escribe.  

## 2013-03-08 ENCOUNTER — Other Ambulatory Visit: Payer: Self-pay | Admitting: Cardiology

## 2013-03-08 MED ORDER — ENALAPRIL MALEATE 5 MG PO TABS: 5.0000 mg | ORAL_TABLET | Freq: Every day | ORAL | Status: DC

## 2013-03-13 ENCOUNTER — Other Ambulatory Visit: Payer: Self-pay | Admitting: Cardiology

## 2013-03-13 MED ORDER — ENALAPRIL MALEATE 5 MG PO TABS: 5.0000 mg | ORAL_TABLET | Freq: Every day | ORAL | Status: DC

## 2013-07-07 ENCOUNTER — Ambulatory Visit: Payer: Medicare Other | Admitting: Cardiology

## 2013-07-18 ENCOUNTER — Ambulatory Visit (INDEPENDENT_AMBULATORY_CARE_PROVIDER_SITE_OTHER): Payer: Medicare Other | Admitting: Cardiology

## 2013-07-18 ENCOUNTER — Encounter: Payer: Self-pay | Admitting: Cardiology

## 2013-07-18 VITALS — BP 104/55 | HR 69 | Ht 61.0 in | Wt 90.0 lb

## 2013-07-18 DIAGNOSIS — I4891 Unspecified atrial fibrillation: Secondary | ICD-10-CM

## 2013-07-18 DIAGNOSIS — R609 Edema, unspecified: Secondary | ICD-10-CM

## 2013-07-18 DIAGNOSIS — I10 Essential (primary) hypertension: Secondary | ICD-10-CM

## 2013-07-18 NOTE — Assessment & Plan Note (Signed)
Blood pressure is well controlled 

## 2013-07-18 NOTE — Progress Notes (Signed)
Clinical Summary Lynn Evans is a 79 y.o.female last seen in April. She is here with her son today. Reports no palpitations or chest pain. Leg edema has completely resolved. She continues to remain very weak, is in a wheelchair most of the time now. Functionally limited at baseline.  Echocardiogram done in September 2013 revealed mild LVH LVEF 60%, aortic valve sclerosis with mild to moderate aortic regurgitation, mild to moderate mitral regurgitation, moderate left atrial enlargement, mild right atrial enlargement, moderate tricuspid regurgitation. Pulmonary pressure was not listed unfortunately, however the TR velocity was 334 cm/s - consistent with elevated pressure.  She has declined anticoagulation, managed with low-dose aspirin and amiodarone for atrial fibrillation.  Lab work in April showed normal LFTs, significantly abnormal TSH greater than 100 with history of thyroid disease - results reported to Dr. Sherril Croon patient to followup for further management. She states that she has seen Dr. Sherril Croon, Synthroid dose being adjusted. She has a pending visit in early December.  No Known Allergies  Current Outpatient Prescriptions  Medication Sig Dispense Refill  . amiodarone (PACERONE) 200 MG tablet Take 1 tablet (200 mg total) by mouth daily.  30 tablet  6  . aspirin 81 MG tablet Take 81 mg by mouth daily.      . budesonide-formoterol (SYMBICORT) 160-4.5 MCG/ACT inhaler Inhale 2 puffs into the lungs 2 (two) times daily.      . enalapril (VASOTEC) 5 MG tablet Take 1 tablet (5 mg total) by mouth daily.  30 tablet  6  . furosemide (LASIX) 20 MG tablet Take 1 tablet (20 mg total) by mouth daily.  30 tablet  6  . levothyroxine (SYNTHROID, LEVOTHROID) 125 MCG tablet Take 125 mcg by mouth daily before breakfast.      . polyethylene glycol (MIRALAX / GLYCOLAX) packet Take 17 g by mouth daily.       No current facility-administered medications for this visit.    Past Medical History  Diagnosis Date  .  Chronic diastolic heart failure   . COPD (chronic obstructive pulmonary disease)   . Atrial fibrillation   . Arthritis   . H/O fracture of left hip   . Essential hypertension, benign   . Carotid artery disease     Mild nonobstructive  . Hypothyroidism   . Chronic edema   . Pulmonary hypertension     Moderate to severe  . CKD (chronic kidney disease) stage 3, GFR 30-59 ml/min     Social History Ms. Goon reports that she quit smoking about 2 years ago. Her smoking use included Cigarettes. She has a 57 pack-year smoking history. She has quit using smokeless tobacco. Ms. Sprowl reports that she does not drink alcohol.  Review of Systems Fair appetite. No orthopnea or PND. Negative except as outlined.  Physical Examination Filed Vitals:   07/18/13 1353  BP: 104/55  Pulse: 69   Filed Weights   07/18/13 1353  Weight: 90 lb (40.824 kg)    Thin, chronically ill-appearing elderly woman, in wheelchair.  HEENT: Conjunctiva and lids normal, oropharynx clear.  Neck: Supple, no elevated JVP, no thyromegaly.  Lungs: Clear to auscultation, nonlabored breathing at rest.  Cardiac: Regular rate and rhythm, no S3, 2/6 systolic murmur, no pericardial rub.  Abdomen: Soft, nontender, bowel sounds present, no guarding or rebound.  Extremities: No significant edema, distal pulses 1-2+.  Skin: Warm and dry.  Musculoskeletal: Kyphosis.  Neuropsychiatric: Alert and oriented x3, affect grossly appropriate.   Problem List and Plan  Atrial fibrillation Maintaining sinus rhythm. As per above discussion, plan to continue aspirin and amiodarone. Keep followup with Dr. Sherril Croon. We will see her back in 6 months.  Peripheral edema Completely resolved. Continue current treatment.  HTN (hypertension) Blood pressure is well-controlled.    Jonelle Sidle, M.D., F.A.C.C.

## 2013-07-18 NOTE — Assessment & Plan Note (Signed)
Completely resolved. Continue current treatment.

## 2013-07-18 NOTE — Patient Instructions (Addendum)
Your physician wants you to follow-up in: 6 months  You will receive a reminder letter in the mail two months in advance. If you don't receive a letter, please call our office to schedule the follow-up appointment.  Your physician recommends that you continue on your current medications as directed. Please refer to the Current Medication list given to you today.  

## 2013-07-18 NOTE — Assessment & Plan Note (Signed)
Maintaining sinus rhythm. As per above discussion, plan to continue aspirin and amiodarone. Keep followup with Dr. Sherril Croon. We will see her back in 6 months.

## 2013-07-31 ENCOUNTER — Other Ambulatory Visit: Payer: Self-pay | Admitting: *Deleted

## 2013-07-31 MED ORDER — FUROSEMIDE 20 MG PO TABS: 20.0000 mg | ORAL_TABLET | Freq: Every day | ORAL | Status: DC

## 2013-08-25 ENCOUNTER — Other Ambulatory Visit: Payer: Self-pay | Admitting: Cardiology

## 2013-08-25 MED ORDER — AMIODARONE HCL 200 MG PO TABS: 200.0000 mg | ORAL_TABLET | Freq: Every day | ORAL | Status: DC

## 2013-10-09 ENCOUNTER — Other Ambulatory Visit: Payer: Self-pay | Admitting: *Deleted

## 2013-10-09 MED ORDER — ENALAPRIL MALEATE 5 MG PO TABS: 5.0000 mg | ORAL_TABLET | Freq: Every day | ORAL | Status: DC

## 2014-01-19 ENCOUNTER — Ambulatory Visit: Payer: Medicare Other | Admitting: Cardiology

## 2014-01-22 NOTE — Progress Notes (Signed)
    HPI: Lynn Evans,  is a 80 year old female pt of  Dr. Diona Browner we are following for ongoing assessment and management of atrial fibrillation, (she declined anticoagulation is managed with low-dose aspirin) on amiodarone for rate control, hypertension, with most recent echocardiogram in September 2013 revealing mild LVH, LVEF of 60% other history includes thyroid disease which is followed by Dr. Sherril Croon. Labs 3 months ago.  On last office visit the patient was maintaining normal sinus rhythm,without medication changes. Her peripheral edema was completely resolved and her blood pressure was well controlled.  She comes today without new complaint. She is still in a wheelchair stating that her feet and ankles hurt too much to walk. She denies chest pain or dyspnea.   No Known Allergies  Current Outpatient Prescriptions  Medication Sig Dispense Refill  . amiodarone (PACERONE) 200 MG tablet Take 1 tablet (200 mg total) by mouth daily.  30 tablet  6  . aspirin 81 MG tablet Take 81 mg by mouth daily.      . budesonide-formoterol (SYMBICORT) 160-4.5 MCG/ACT inhaler Inhale 2 puffs into the lungs 2 (two) times daily.      . enalapril (VASOTEC) 5 MG tablet Take 1 tablet (5 mg total) by mouth daily.  30 tablet  6  . furosemide (LASIX) 20 MG tablet Take 1 tablet (20 mg total) by mouth daily.  30 tablet  6  . levothyroxine (SYNTHROID, LEVOTHROID) 125 MCG tablet Take 125 mcg by mouth daily before breakfast.      . polyethylene glycol (MIRALAX / GLYCOLAX) packet Take 17 g by mouth daily.       No current facility-administered medications for this visit.    Past Medical History  Diagnosis Date  . Chronic diastolic heart failure   . COPD (chronic obstructive pulmonary disease)   . Atrial fibrillation   . Arthritis   . H/O fracture of left hip   . Essential hypertension, benign   . Carotid artery disease     Mild nonobstructive  . Hypothyroidism   . Chronic edema   . Pulmonary hypertension    Moderate to severe  . CKD (chronic kidney disease) stage 3, GFR 30-59 ml/min     Past Surgical History  Procedure Laterality Date  . Hemorrhoid surgery    . Hip arthroplasty  02/14/2012    Procedure: ARTHROPLASTY BIPOLAR HIP;  Surgeon: Senaida Lange, MD;  Location: [facility]S;  Service: Orthopedics;  Laterality: Left;    ROS: Review of systems complete and found to be negative unless listed above  PHYSICAL EXAM There were no vitals taken for this visit. General: Well developed, well nourished, thin, in no acute distress Head: Eyes PERRLA, No xanthomas.   Normal cephalic and atramatic  Lungs: Clear bilaterally to auscultation and percussion. Heart: HRRR S1 S2, without MRG.  Pulses are 2+ & equal.            No carotid bruit. No JVD.  No abdominal bruits. No femoral bruits. Abdomen: Bowel sounds are positive, abdomen soft and non-tender without masses or                  Hernia's noted. Msk:  Back normal,wheel chair for ambulation. Diminished overall strength and tone for age. Extremities: No clubbing, cyanosis or edema.  DP +1 Neuro: Alert and oriented X 3. Psych:  Good affect, responds appropriately   ASSESSMENT AND PLAN

## 2014-01-23 ENCOUNTER — Encounter: Payer: Self-pay | Admitting: Adult Health

## 2014-01-23 ENCOUNTER — Ambulatory Visit (INDEPENDENT_AMBULATORY_CARE_PROVIDER_SITE_OTHER): Payer: Medicare Other | Admitting: Adult Health

## 2014-01-23 VITALS — BP 150/92 | HR 60 | Ht 60.0 in | Wt 100.0 lb

## 2014-01-23 DIAGNOSIS — I4891 Unspecified atrial fibrillation: Secondary | ICD-10-CM

## 2014-01-23 DIAGNOSIS — I10 Essential (primary) hypertension: Secondary | ICD-10-CM

## 2014-01-23 NOTE — Progress Notes (Signed)
Name: Lynn Evans    DOB: 1933/10/29  Age: 80 y.o.  MR#: [id number]       PCP:  Ignatius Specking., MD      Insurance: Payor: MEDICARE / Plan: MEDICARE PART A AND B / Product Type: *No Product type* /   CC:    Chief Complaint  Patient presents with  . Hypertension  . Atrial Fibrillation    VS Filed Vitals:   01/23/14 1315  BP: 150/92  Pulse: 60  Height: 5' (1.524 m)  Weight: 100 lb (45.36 kg)  SpO2: 94%    Weights Current Weight  01/23/14 100 lb (45.36 kg)  07/18/13 90 lb (40.824 kg)  12/08/12 88 lb (39.917 kg)    Blood Pressure  BP Readings from Last 3 Encounters:  01/23/14 150/92  07/18/13 104/55  12/08/12 125/77     Admit date:  (Not on file) Last encounter with RMR:  Visit date not found   Allergy Review of patient's allergies indicates no known allergies.  Current Outpatient Prescriptions  Medication Sig Dispense Refill  . amiodarone (PACERONE) 200 MG tablet Take 1 tablet (200 mg total) by mouth daily.  30 tablet  6  . aspirin 81 MG tablet Take 81 mg by mouth daily.      . budesonide-formoterol (SYMBICORT) 160-4.5 MCG/ACT inhaler Inhale 2 puffs into the lungs 2 (two) times daily.      . furosemide (LASIX) 20 MG tablet Take 1 tablet (20 mg total) by mouth daily.  30 tablet  6  . levothyroxine (SYNTHROID, LEVOTHROID) 125 MCG tablet Take 125 mcg by mouth daily before breakfast.      . polyethylene glycol (MIRALAX / GLYCOLAX) packet Take 17 g by mouth daily.       No current facility-administered medications for this visit.    Discontinued Meds:    Medications Discontinued During This Encounter  Medication Reason  . enalapril (VASOTEC) 5 MG tablet Error    Patient Active Problem List   Diagnosis Date Noted  . Health maintenance examination 12/08/2012  . CKD (chronic kidney disease) stage 3, GFR 30-59 ml/min 08/03/2012  . Peripheral edema 04/27/2012  . Atrial fibrillation 02/13/2012  . COPD (chronic obstructive pulmonary disease) 12/17/2011  . HTN  (hypertension) 12/17/2011  . Pulmonary hypertension 12/17/2011    LABS    Component Value Date/Time   NA 130* 02/20/2012 1458   NA 133* 02/19/2012 0420   NA 134* 02/18/2012 0420   K 4.6 02/20/2012 1458   K 4.1 02/19/2012 0420   K 3.3* 02/18/2012 0420   CL 91* 02/20/2012 1458   CL 94* 02/19/2012 0420   CL 92* 02/18/2012 0420   CO2 30 02/20/2012 1458   CO2 33* 02/19/2012 0420   CO2 34* 02/18/2012 0420   GLUCOSE 134* 02/20/2012 1458   GLUCOSE 104* 02/19/2012 0420   GLUCOSE 118* 02/18/2012 0420   BUN 30* 02/20/2012 1458   BUN 22 02/19/2012 0420   BUN 19 02/18/2012 0420   CREATININE 1.13* 02/20/2012 1458   CREATININE 1.05 02/19/2012 0420   CREATININE 1.04 02/18/2012 0420   CALCIUM 8.8 02/20/2012 1458   CALCIUM 8.7 02/19/2012 0420   CALCIUM 8.6 02/18/2012 0420   GFRNONAA 46* 02/20/2012 1458   GFRNONAA 50* 02/19/2012 0420   GFRNONAA 51* 02/18/2012 0420   GFRAA 53* 02/20/2012 1458   GFRAA 58* 02/19/2012 0420   GFRAA 59* 02/18/2012 0420   CMP     Component Value Date/Time   NA 130* 02/20/2012 1458   K 4.6  02/20/2012 1458   CL 91* 02/20/2012 1458   CO2 30 02/20/2012 1458   GLUCOSE 134* 02/20/2012 1458   BUN 30* 02/20/2012 1458   CREATININE 1.13* 02/20/2012 1458   CALCIUM 8.8 02/20/2012 1458   PROT 6.9 02/13/2012 2155   ALBUMIN 3.2* 02/13/2012 2155   AST 22 02/13/2012 2155   ALT 16 02/13/2012 2155   ALKPHOS 45 02/13/2012 2155   BILITOT 0.4 02/13/2012 2155   GFRNONAA 46* 02/20/2012 1458   GFRAA 53* 02/20/2012 1458       Component Value Date/Time   WBC 12.5* 02/22/2012 1145   WBC 9.7 02/19/2012 0420   WBC 10.6* 02/18/2012 0420   HGB 10.8* 02/22/2012 1145   HGB 8.9* 02/19/2012 0420   HGB 8.9* 02/18/2012 0420   HCT 34.1* 02/22/2012 1145   HCT 27.5* 02/19/2012 0420   HCT 26.9* 02/18/2012 0420   MCV 90.0 02/22/2012 1145   MCV 89.0 02/19/2012 0420   MCV 87.3 02/18/2012 0420    Lipid Panel     Component Value Date/Time   CHOL 160 12/18/2011 0530   TRIG 79 12/18/2011 0530   HDL 56 12/18/2011 0530   CHOLHDL 2.9 12/18/2011  0530   VLDL 16 12/18/2011 0530   LDLCALC 88 12/18/2011 0530    ABG    Component Value Date/Time   PHART 7.470* 12/19/2011 1115   PCO2ART 50.2* 12/19/2011 1115   PO2ART 86.0 12/19/2011 1115   HCO3 36.5* 12/19/2011 1115   TCO2 38 12/19/2011 1115   O2SAT 97.0 12/19/2011 1115     No results found for this basename: TSH   BNP (last 3 results) No results found for this basename: PROBNP,  in the last 8760 hours Cardiac Panel (last 3 results) No results found for this basename: CKTOTAL, CKMB, TROPONINI, RELINDX,  in the last 72 hours  Iron/TIBC/Ferritin No results found for this basename: iron, tibc, ferritin     EKG Orders placed in visit on 07/18/13  . EKG 12-LEAD     Prior Assessment and Plan Problem List as of 01/23/2014     Cardiovascular and Mediastinum   HTN (hypertension)   Last Assessment & Plan   07/18/2013 Office Visit Written 07/18/2013  2:14 PM by Jonelle SidleSamuel G McDowell, MD     Blood pressure is well-controlled.    Pulmonary hypertension   Atrial fibrillation   Last Assessment & Plan   07/18/2013 Office Visit Written 07/18/2013  2:13 PM by Jonelle SidleSamuel G McDowell, MD     Maintaining sinus rhythm. As per above discussion, plan to continue aspirin and amiodarone. Keep followup with Dr. Sherril CroonVyas. We will see her back in 6 months.      Respiratory   COPD (chronic obstructive pulmonary disease)   Last Assessment & Plan   12/30/2011 Office Visit Written 12/30/2011  3:13 PM by Rande BruntEugene C Serpe, PA     Patient on continuous supplemental O2. Follow up with Dr. Sandrea HughsMichael Wert, as scheduled.      Genitourinary   CKD (chronic kidney disease) stage 3, GFR 30-59 ml/min   Last Assessment & Plan   12/08/2012 Office Visit Written 12/08/2012  1:47 PM by Jonelle SidleSamuel G McDowell, MD     Last creatinine 2.0. Encouraged followup with Dr. Sherril CroonVyas.      Other   Peripheral edema   Last Assessment & Plan   07/18/2013 Office Visit Written 07/18/2013  2:14 PM by Jonelle SidleSamuel G McDowell, MD     Completely resolved. Continue  current treatment.    Health maintenance examination  Last Assessment & Plan   12/08/2012 Office Visit Written 12/08/2012  1:45 PM by Jonelle Sidle, MD     She is having problems with hip pain, also weight loss. I have encouraged her once again to follow up with her primary care physician Dr. Sherril Croon for further evaluation.        Imaging: No results found.

## 2014-01-23 NOTE — Patient Instructions (Signed)
Your physician wants you to follow-up in: 6 months You will receive a reminder letter in the mail two months in advance. If you don't receive a letter, please call our office to schedule the follow-up appointment.     Your physician recommends that you continue on your current medications as directed. Please refer to the Current Medication list given to you today.      Thank you for choosing [facility] !        

## 2014-01-23 NOTE — Progress Notes (Deleted)
Name: Lynn Evans    DOB: [birth year]  Age: 80 y.o.  MR#: [id number]       PCP:  Ignatius Specking., MD      Insurance: Payor: MEDICARE / Plan: MEDICARE PART A AND B / Product Type: *No Product type* /   CC:    Chief Complaint  Patient presents with  . Hypertension  . Atrial Fibrillation    VS There were no vitals filed for this visit.  Weights Current Weight  07/18/13 90 lb (40.824 kg)  12/08/12 88 lb (39.917 kg)  08/03/12 98 lb (44.453 kg)    Blood Pressure  BP Readings from Last 3 Encounters:  07/18/13 104/55  12/08/12 125/77  08/03/12 124/71     Admit date:  (Not on file) Last encounter with RMR:  Visit date not found   Allergy Review of patient's allergies indicates no known allergies.  Current Outpatient Prescriptions  Medication Sig Dispense Refill  . amiodarone (PACERONE) 200 MG tablet Take 1 tablet (200 mg total) by mouth daily.  30 tablet  6  . aspirin 81 MG tablet Take 81 mg by mouth daily.      . budesonide-formoterol (SYMBICORT) 160-4.5 MCG/ACT inhaler Inhale 2 puffs into the lungs 2 (two) times daily.      . enalapril (VASOTEC) 5 MG tablet Take 1 tablet (5 mg total) by mouth daily.  30 tablet  6  . furosemide (LASIX) 20 MG tablet Take 1 tablet (20 mg total) by mouth daily.  30 tablet  6  . levothyroxine (SYNTHROID, LEVOTHROID) 125 MCG tablet Take 125 mcg by mouth daily before breakfast.      . polyethylene glycol (MIRALAX / GLYCOLAX) packet Take 17 g by mouth daily.       No current facility-administered medications for this visit.    Discontinued Meds:   There are no discontinued medications.  Patient Active Problem List   Diagnosis Date Noted  . Health maintenance examination 12/08/2012  . CKD (chronic kidney disease) stage 3, GFR 30-59 ml/min 08/03/2012  . Peripheral edema 04/27/2012  . Atrial fibrillation 02/13/2012  . COPD (chronic obstructive pulmonary disease) 12/17/2011  . HTN (hypertension) 12/17/2011  . Pulmonary hypertension 12/17/2011     LABS    Component Value Date/Time   NA 130* 02/20/2012 1458   NA 133* 02/19/2012 0420   NA 134* 02/18/2012 0420   K 4.6 02/20/2012 1458   K 4.1 02/19/2012 0420   K 3.3* 02/18/2012 0420   CL 91* 02/20/2012 1458   CL 94* 02/19/2012 0420   CL 92* 02/18/2012 0420   CO2 30 02/20/2012 1458   CO2 33* 02/19/2012 0420   CO2 34* 02/18/2012 0420   GLUCOSE 134* 02/20/2012 1458   GLUCOSE 104* 02/19/2012 0420   GLUCOSE 118* 02/18/2012 0420   BUN 30* 02/20/2012 1458   BUN 22 02/19/2012 0420   BUN 19 02/18/2012 0420   CREATININE 1.13* 02/20/2012 1458   CREATININE 1.05 02/19/2012 0420   CREATININE 1.04 02/18/2012 0420   CALCIUM 8.8 02/20/2012 1458   CALCIUM 8.7 02/19/2012 0420   CALCIUM 8.6 02/18/2012 0420   GFRNONAA 46* 02/20/2012 1458   GFRNONAA 50* 02/19/2012 0420   GFRNONAA 51* 02/18/2012 0420   GFRAA 53* 02/20/2012 1458   GFRAA 58* 02/19/2012 0420   GFRAA 59* 02/18/2012 0420   CMP     Component Value Date/Time   NA 130* 02/20/2012 1458   K 4.6 02/20/2012 1458   CL 91* 02/20/2012 1458   CO2 30 02/20/2012  1458   GLUCOSE 134* 02/20/2012 1458   BUN 30* 02/20/2012 1458   CREATININE 1.13* 02/20/2012 1458   CALCIUM 8.8 02/20/2012 1458   PROT 6.9 02/13/2012 2155   ALBUMIN 3.2* 02/13/2012 2155   AST 22 02/13/2012 2155   ALT 16 02/13/2012 2155   ALKPHOS 45 02/13/2012 2155   BILITOT 0.4 02/13/2012 2155   GFRNONAA 46* 02/20/2012 1458   GFRAA 53* 02/20/2012 1458       Component Value Date/Time   WBC 12.5* 02/22/2012 1145   WBC 9.7 02/19/2012 0420   WBC 10.6* 02/18/2012 0420   HGB 10.8* 02/22/2012 1145   HGB 8.9* 02/19/2012 0420   HGB 8.9* 02/18/2012 0420   HCT 34.1* 02/22/2012 1145   HCT 27.5* 02/19/2012 0420   HCT 26.9* 02/18/2012 0420   MCV 90.0 02/22/2012 1145   MCV 89.0 02/19/2012 0420   MCV 87.3 02/18/2012 0420    Lipid Panel     Component Value Date/Time   CHOL 160 12/18/2011 0530   TRIG 79 12/18/2011 0530   HDL 56 12/18/2011 0530   CHOLHDL 2.9 12/18/2011 0530   VLDL 16 12/18/2011 0530   LDLCALC 88 12/18/2011 0530     ABG    Component Value Date/Time   PHART 7.470* 12/19/2011 1115   PCO2ART 50.2* 12/19/2011 1115   PO2ART 86.0 12/19/2011 1115   HCO3 36.5* 12/19/2011 1115   TCO2 38 12/19/2011 1115   O2SAT 97.0 12/19/2011 1115     No results found for this basename: TSH   BNP (last 3 results) No results found for this basename: PROBNP,  in the last 8760 hours Cardiac Panel (last 3 results) No results found for this basename: CKTOTAL, CKMB, TROPONINI, RELINDX,  in the last 72 hours  Iron/TIBC/Ferritin No results found for this basename: iron, tibc, ferritin     EKG Orders placed in visit on 07/18/13  . EKG 12-LEAD     Prior Assessment and Plan Problem List as of 01/23/2014     Cardiovascular and Mediastinum   HTN (hypertension)   Last Assessment & Plan   07/18/2013 Office Visit Written 07/18/2013  2:14 PM by Jonelle Sidle, MD     Blood pressure is well-controlled.    Pulmonary hypertension   Atrial fibrillation   Last Assessment & Plan   07/18/2013 Office Visit Written 07/18/2013  2:13 PM by Jonelle Sidle, MD     Maintaining sinus rhythm. As per above discussion, plan to continue aspirin and amiodarone. Keep followup with Dr. Sherril Croon. We will see her back in 6 months.      Respiratory   COPD (chronic obstructive pulmonary disease)   Last Assessment & Plan   12/30/2011 Office Visit Written 12/30/2011  3:13 PM by Rande Brunt, PA     Patient on continuous supplemental O2. Follow up with Dr. Sandrea Hughs, as scheduled.      Genitourinary   CKD (chronic kidney disease) stage 3, GFR 30-59 ml/min   Last Assessment & Plan   12/08/2012 Office Visit Written 12/08/2012  1:47 PM by Jonelle Sidle, MD     Last creatinine 2.0. Encouraged followup with Dr. Sherril Croon.      Other   Peripheral edema   Last Assessment & Plan   07/18/2013 Office Visit Written 07/18/2013  2:14 PM by Jonelle Sidle, MD     Completely resolved. Continue current treatment.    Health maintenance examination    Last Assessment & Plan   12/08/2012 Office Visit Written 12/08/2012  1:45 PM by Jonelle SidleSamuel G McDowell, MD     She is having problems with hip pain, also weight loss. I have encouraged her once again to follow up with her primary care physician Dr. Sherril CroonVyas for further evaluation.        Imaging: No results found.

## 2014-01-23 NOTE — Assessment & Plan Note (Signed)
She is in NSR Denies palpitations or racing. Continue amiodarone. Continues to refuse anticoagulation. ASA only.

## 2014-01-23 NOTE — Assessment & Plan Note (Signed)
I have rechecked her BP manually in the exam room. 128/68. She states she often gets anxious coming to appts. Will continue current regimen. Labs per Dr. Sherril Croon.

## 2014-03-06 ENCOUNTER — Telehealth: Payer: Self-pay | Admitting: Cardiology

## 2014-03-06 MED ORDER — FUROSEMIDE 20 MG PO TABS: 20.0000 mg | ORAL_TABLET | Freq: Every day | ORAL | Status: AC

## 2014-03-06 NOTE — Telephone Encounter (Signed)
Received fax refill request  Rx # [id number] Medication:  Furosemide 20 mg tab Qty 30 Sig:  Take one tablet by mouth once daily  Physician:  mcDowell

## 2014-04-09 ENCOUNTER — Telehealth: Payer: Self-pay | Admitting: Cardiology

## 2014-04-09 MED ORDER — AMIODARONE HCL 200 MG PO TABS: 200.0000 mg | ORAL_TABLET | Freq: Every day | ORAL | Status: AC

## 2014-04-09 NOTE — Telephone Encounter (Signed)
Received fax refill request  Rx # [id number] Medication:  Pacerone 200 mg tab Qty 30 Sig:  Take one tablet by mouth once daily Physician:  Diona BrownerMcDowell

## 2014-06-27 IMAGING — CT CT ABD-PELV W/ CM
2 of 5 series · 17 of 46 positions shown, 19 images · IV contrast (Omnipaque 300)
Comparison: None.

CLINICAL DATA: Abdominal distension, chronic constipation

CT ABDOMEN AND PELVIS WITH CONTRAST
TECHNIQUE: Multidetector CT imaging of the abdomen and pelvis was
performed following the standard protocol during bolus
administration of intravenous contrast.
Contrast: 100mL OMNIPAQUE IOHEXOL 300 MG/ML  SOLN

[Series 2: abd_pel_with 5.0 b40f · axial · 0.70mm/px · z∈[-404,-64]mm · 14 of 77 slices shown, 16 images]
[im 5/77  soft-tissue]
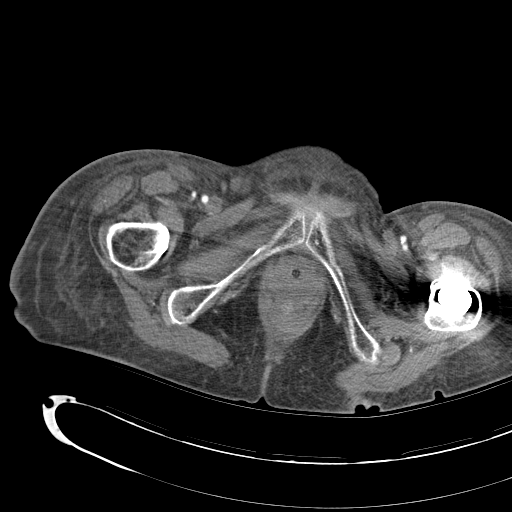
[im 5/77  bone]
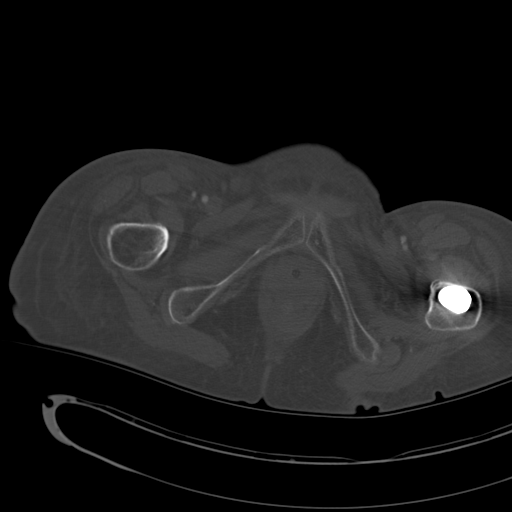
[im 9/77  soft-tissue]
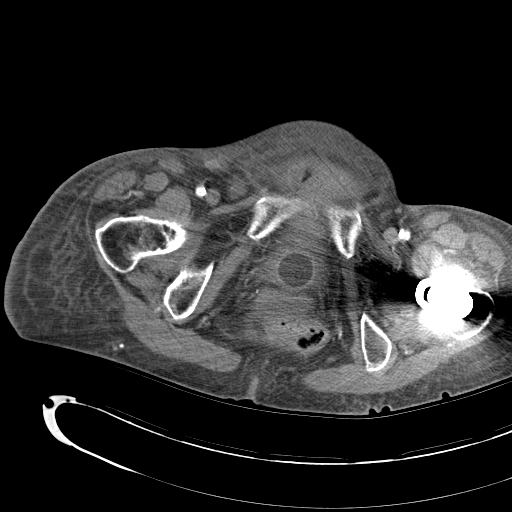
[im 17/77  soft-tissue]
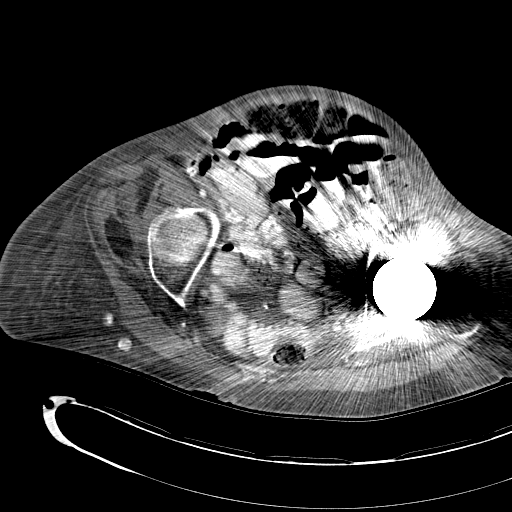
[im 21/77  soft-tissue]
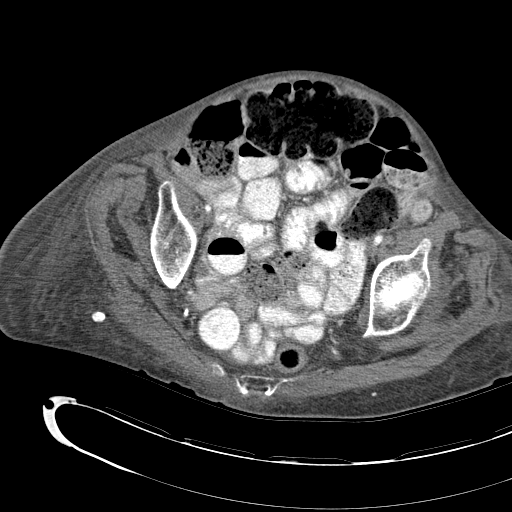
[im 25/77  soft-tissue]
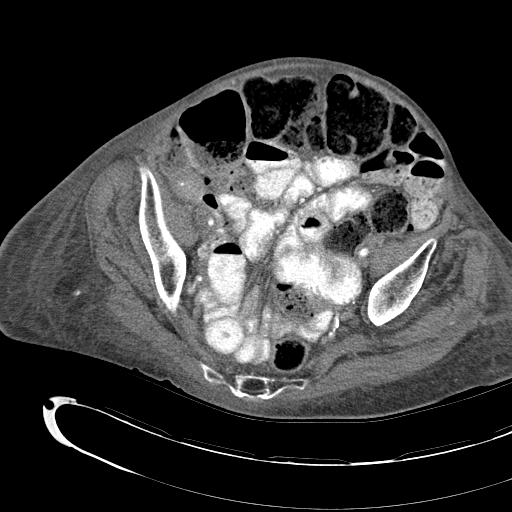
[im 33/77  soft-tissue]
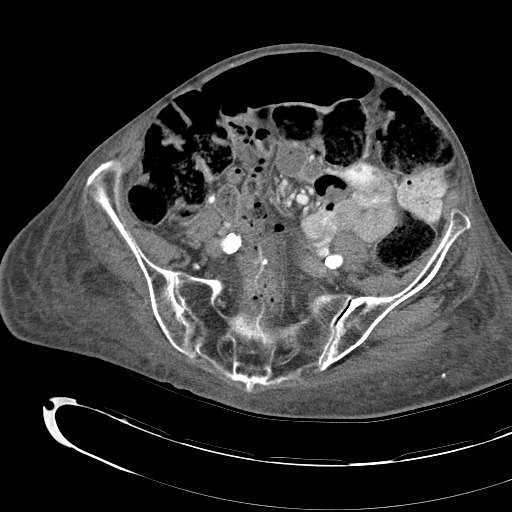
[im 37/77  soft-tissue]
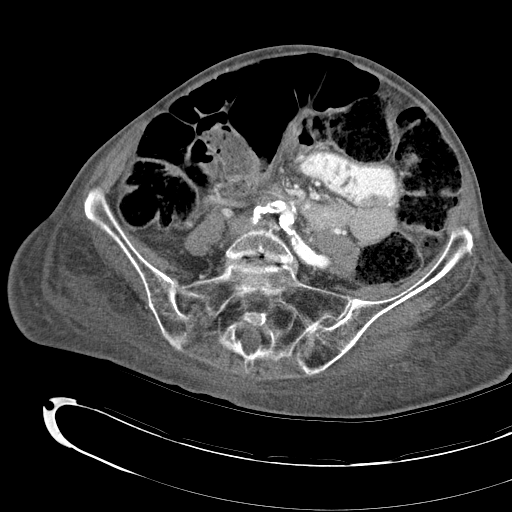
[im 41/77  soft-tissue]
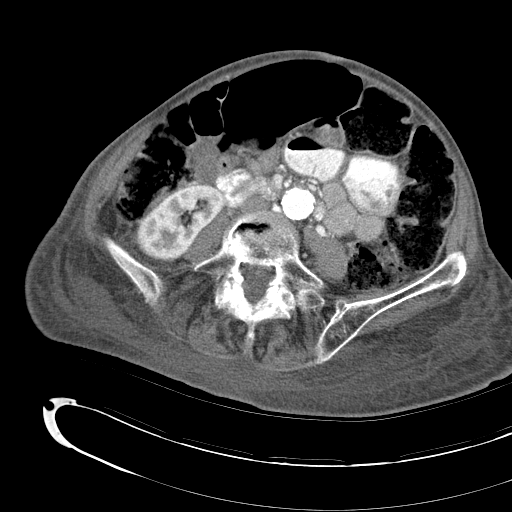
[im 45/77  soft-tissue]
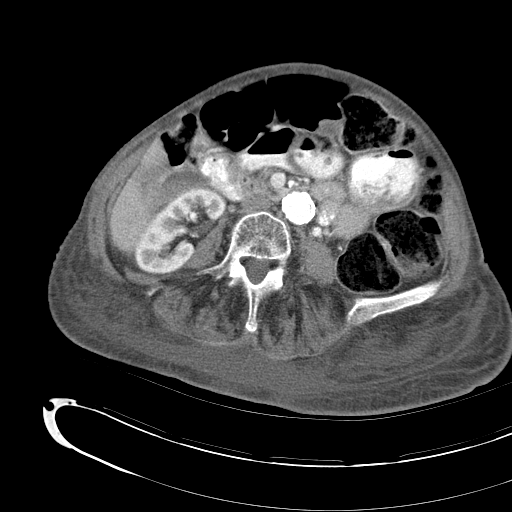
[im 45/77  bone]
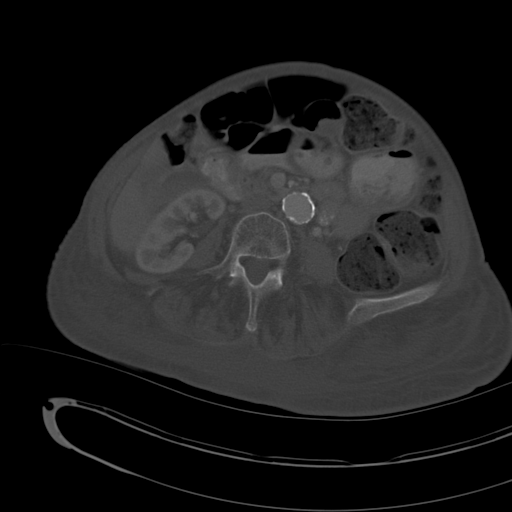
[im 53/77  soft-tissue]
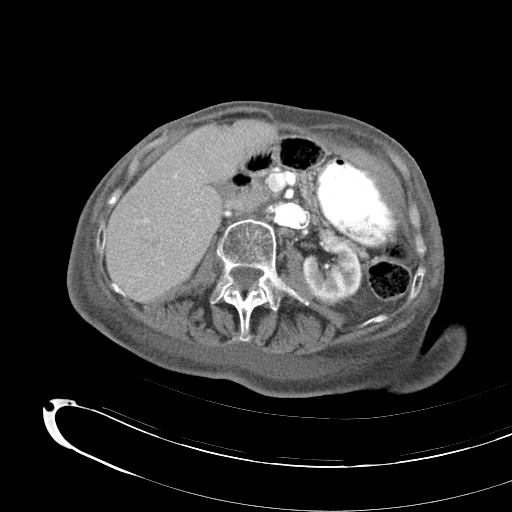
[im 57/77  soft-tissue]
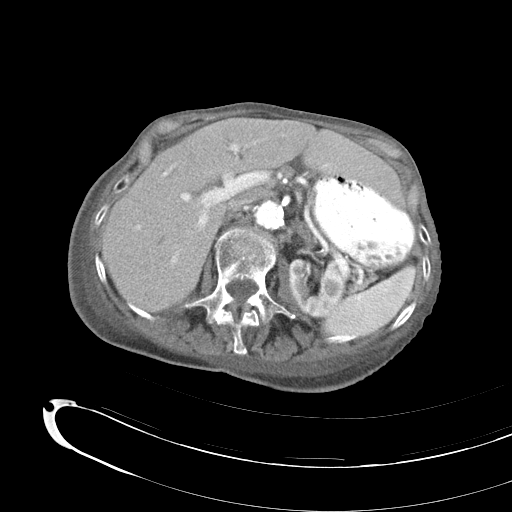
[im 61/77  soft-tissue]
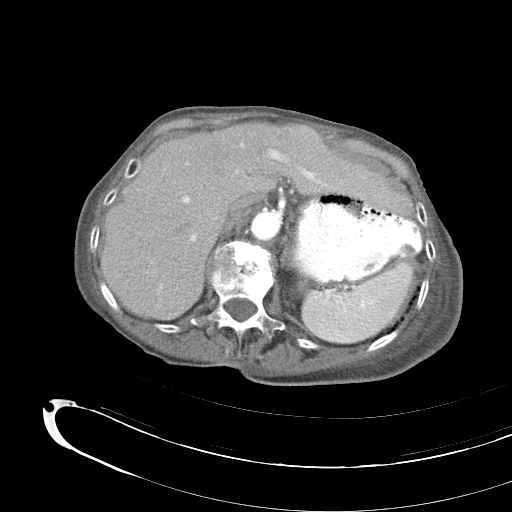
[im 69/77  soft-tissue]
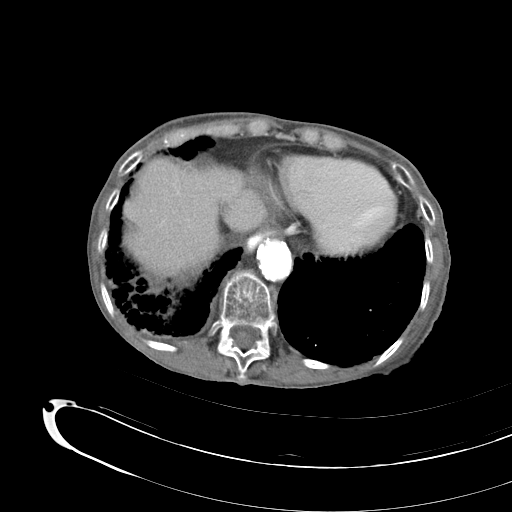
[im 73/77  soft-tissue]
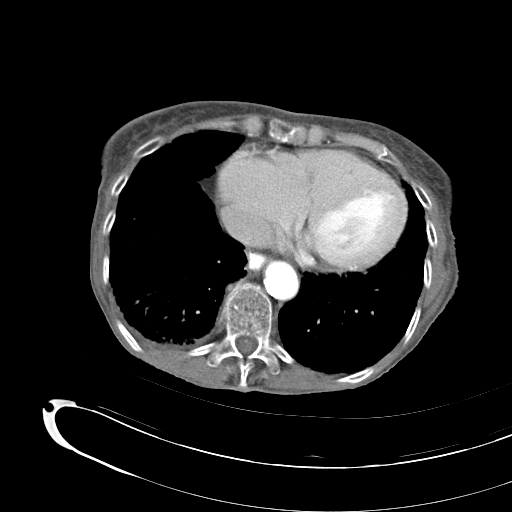

[Series 4: abd_pel_with 3.0 spo · coronal · 0.67mm/px · 3 of 81 slices shown]
[im 27/81  soft-tissue]
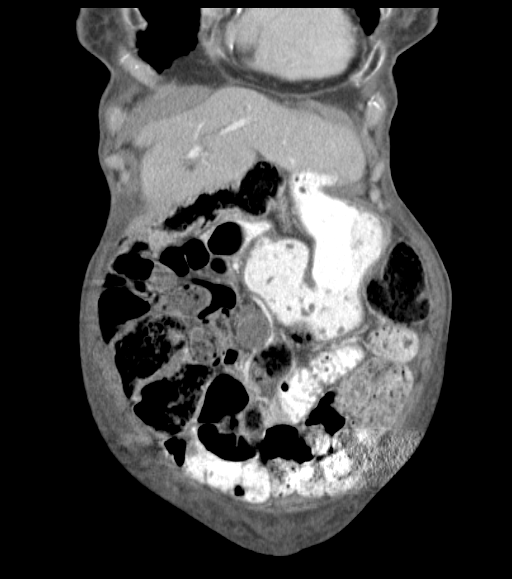
[im 36/81  soft-tissue]
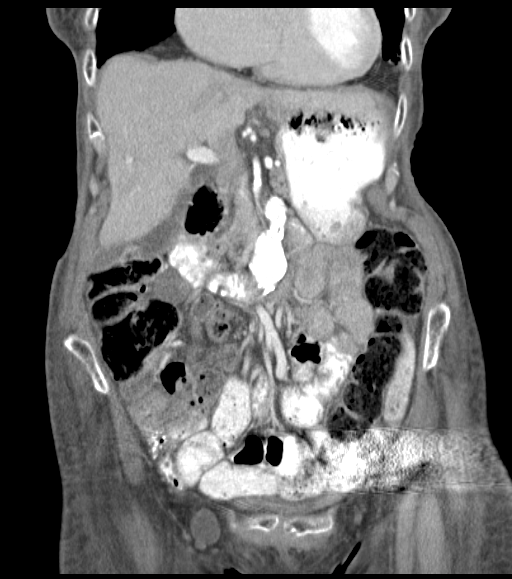
[im 45/81  soft-tissue]
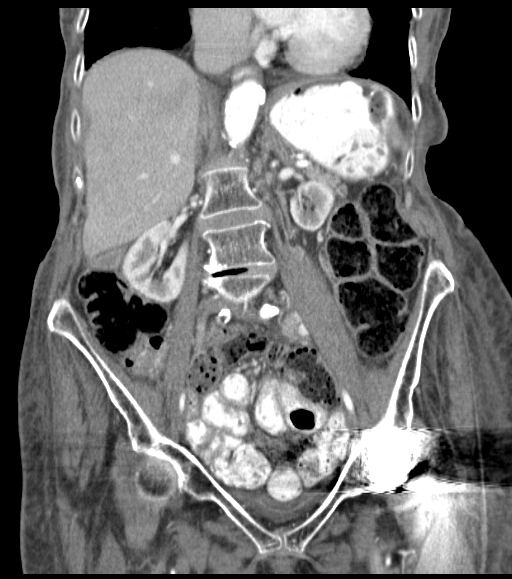

[17 of 46 positions shown; findings below may reference images not displayed]

FINDINGS: Emphysematous changes with fibrosis/honeycombing at the
right lung base.

Liver, spleen, pancreas, and adrenal glands are within normal
limits.

Gallbladder is unremarkable.  No intrahepatic or extrahepatic
ductal dilatation.

Kidneys are within normal limits.  No hydronephrosis.

No evidence of bowel obstruction.  Large volume stool throughout
the colon, reflecting constipation.

Extensive atherosclerotic calcifications of the abdominal aorta and
branch vessels.  Fusiform ectasia of the infrarenal abdominal aorta
measuring up to 2.2 cm in AP diameter.

No abdominopelvic ascites.

No suspicious abdominopelvic lymphadenopathy.

Bladder is decompressed by indwelling Foley catheter. There are
small fluid containing right femoral hernia (series 2/image 71).

Body wall edema.

Exaggerated lumbar lordosis. Degenerative changes of the visualized
thoracolumbar spine.  Grade 1 spondylolisthesis at L5-S1.  Left
total hip arthroplasty.
IMPRESSION: No evidence of bowel obstruction.

Large volume stool throughout the colon, reflecting constipation.

## 2014-07-10 IMAGING — US US EXTREM LOW VENOUS BILAT
1 series · 14 of 24 positions shown · non-contrast
Comparison: None.

CLINICAL DATA: Edema

VENOUS DUPLEX ULTRASOUND OF BILATERAL LOWER EXTREMITIES
TECHNIQUE: Gray-scale sonography with graded compression, as well
as color Doppler and duplex ultrasound, were performed to evaluate
the deep venous system of both lower extremities from the level of
the common femoral vein through the popliteal and proximal calf
veins.  Spectral Doppler was utilized to evaluate flow at rest and
with distal augmentation maneuvers.

[Series 1: us extrem low venous bilat · 0.07mm/px · 14 of 61 slices shown]
[im 1/61]
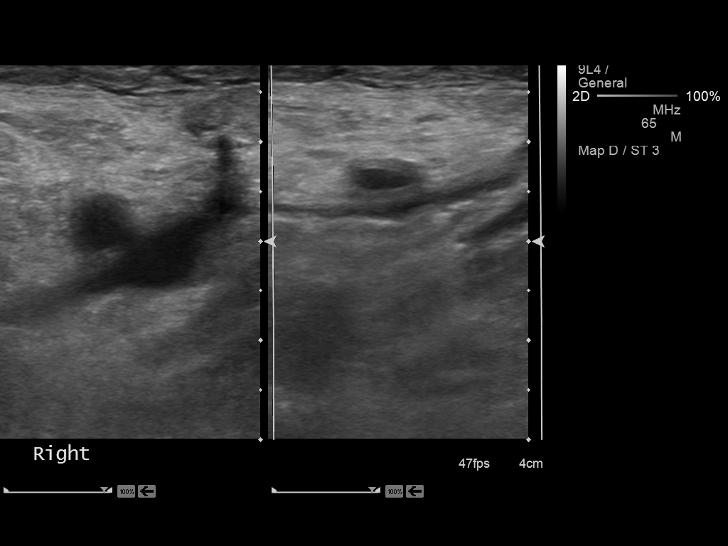
[im 6/61]
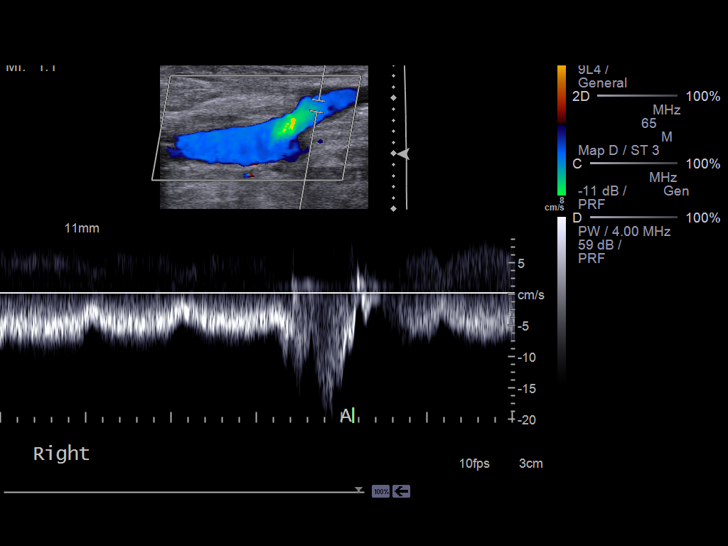
[im 11/61]
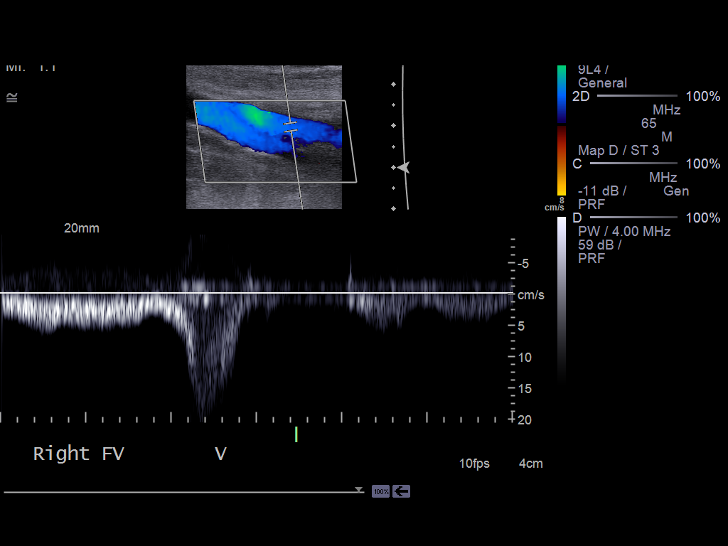
[im 16/61]
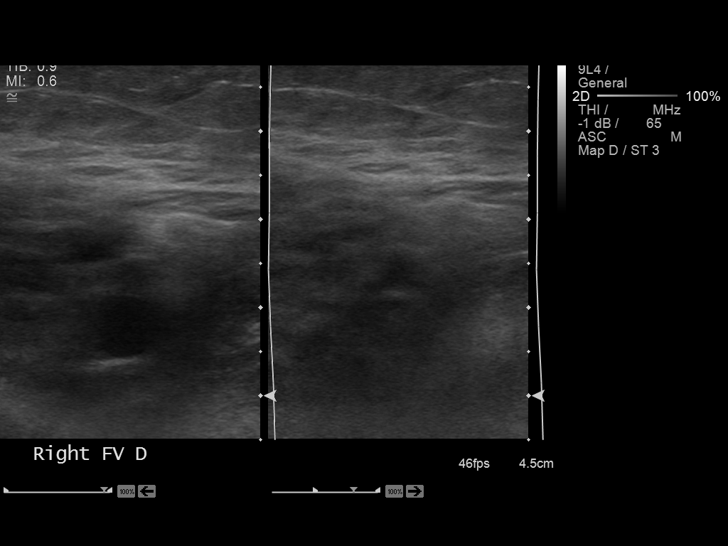
[im 19/61]
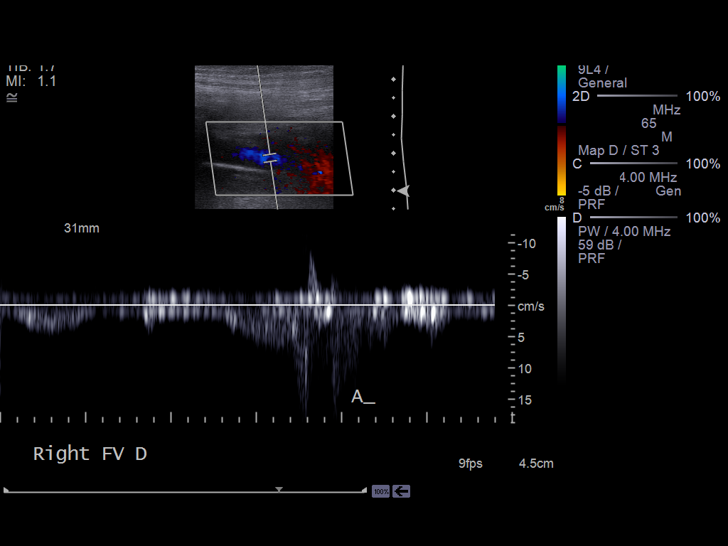
[im 24/61]
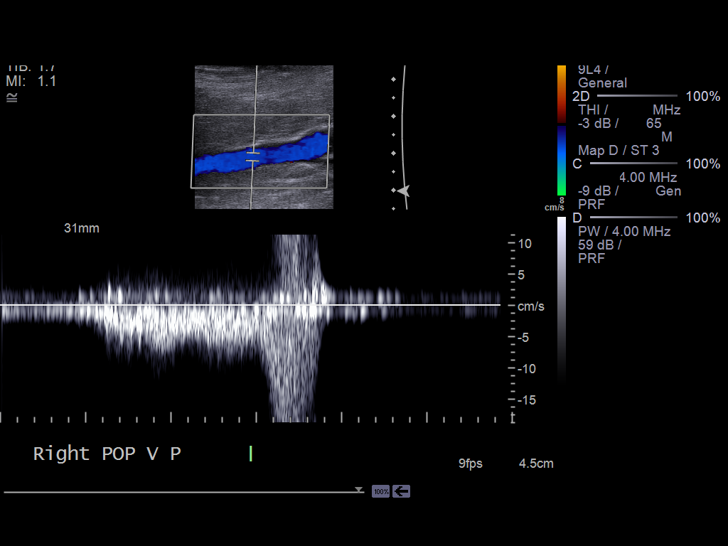
[im 29/61]
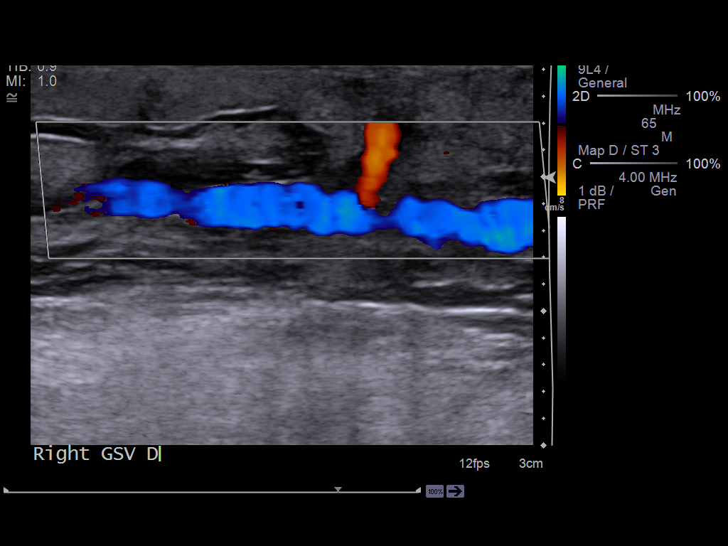
[im 32/61]
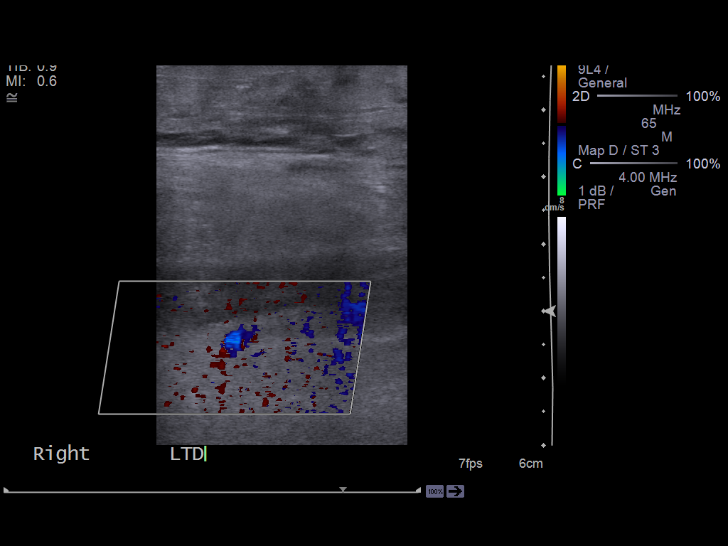
[im 37/61]
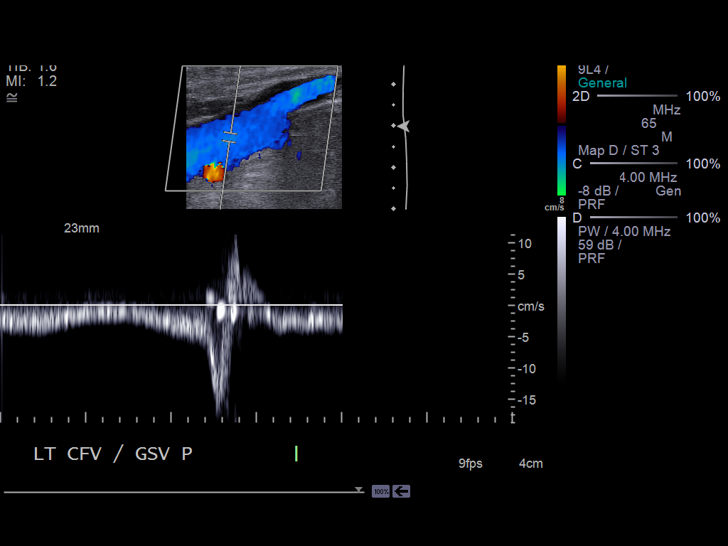
[im 42/61]
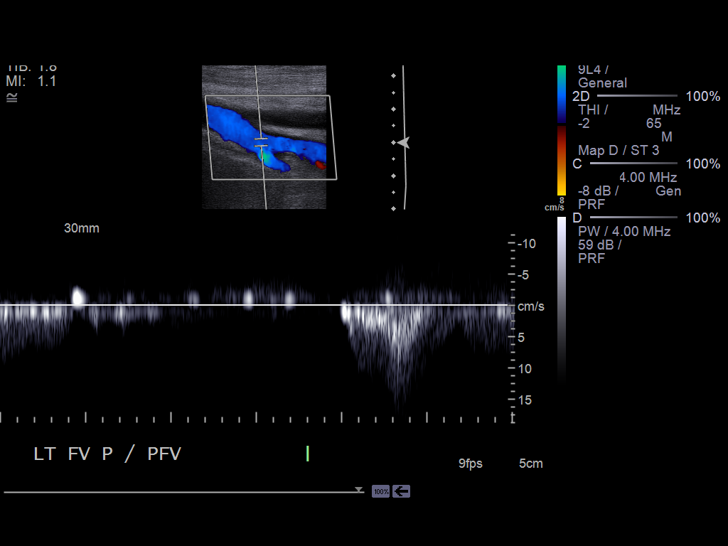
[im 47/61]
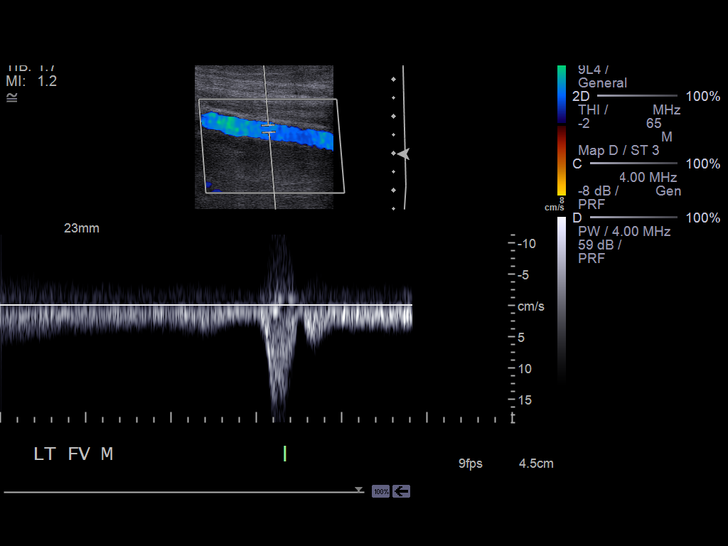
[im 50/61]
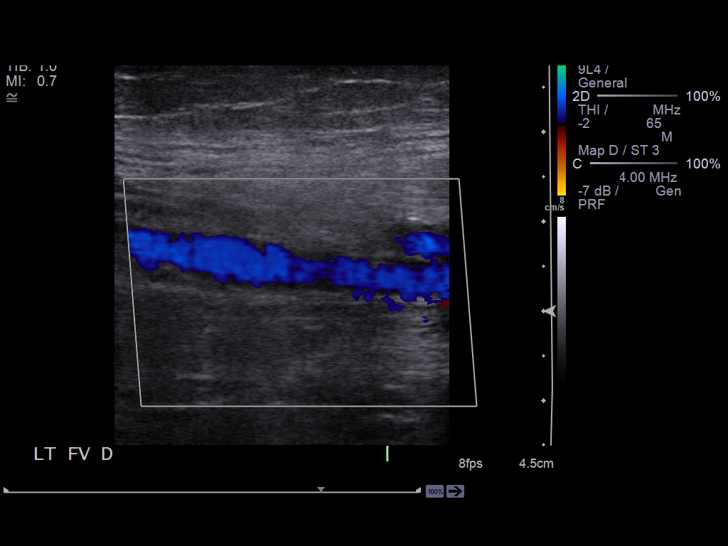
[im 55/61]
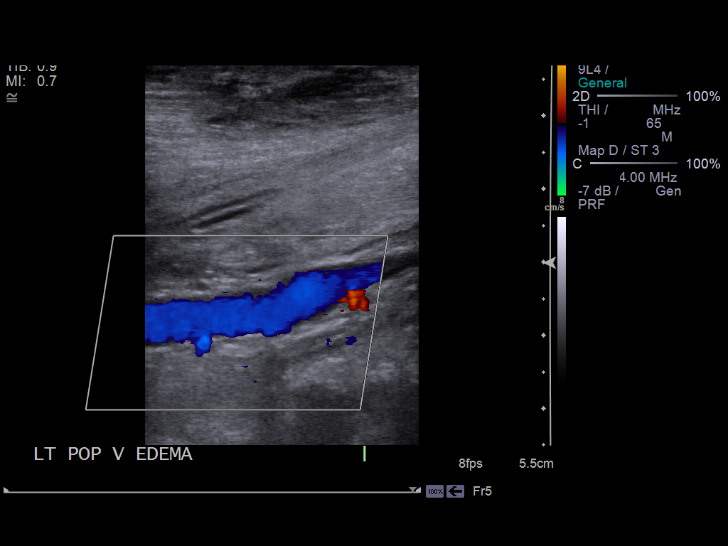
[im 61/61]
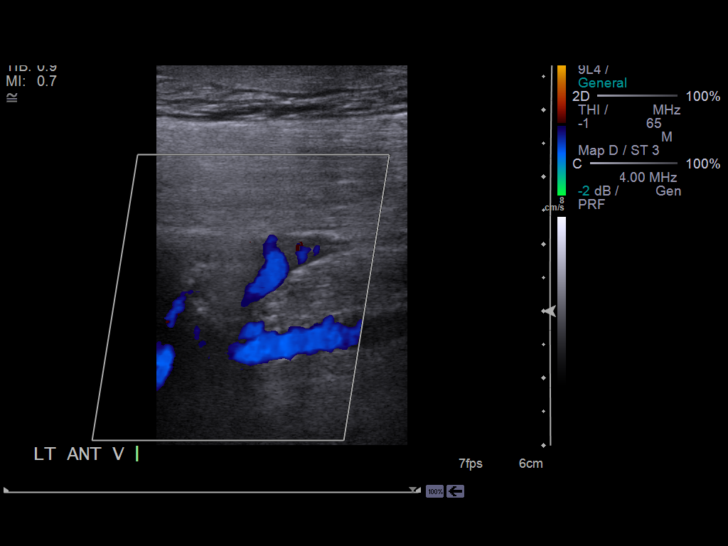

[14 of 24 positions shown; findings below may reference images not displayed]

FINDINGS: The visualized bilateral lower extremity deep venous
systems appear patent.

Normal compressibility.  Patent color Doppler flow.  Satisfactory
spectral Doppler with respiratory variation and response to
augmentation.

Subcutaneous edema in the bilateral calves.
IMPRESSION: No deep venous thrombosis in the visualized bilateral lower
extremities.

Subcutaneous edema in the bilateral calves.

## 2014-10-23 DEATH — deceased
